# Patient Record
Sex: Male | Born: 1980 | Race: White | Hispanic: No | Marital: Single | Smoking: Never smoker
Health system: Southern US, Community
[De-identification: ages and names within clinical notes are randomized; demographics above are authoritative.]

## PROBLEM LIST (undated history)

## (undated) DIAGNOSIS — F32A Depression, unspecified: Secondary | ICD-10-CM

## (undated) DIAGNOSIS — F419 Anxiety disorder, unspecified: Secondary | ICD-10-CM

## (undated) DIAGNOSIS — F988 Other specified behavioral and emotional disorders with onset usually occurring in childhood and adolescence: Secondary | ICD-10-CM

## (undated) DIAGNOSIS — R251 Tremor, unspecified: Secondary | ICD-10-CM

## (undated) DIAGNOSIS — M419 Scoliosis, unspecified: Secondary | ICD-10-CM

## (undated) DIAGNOSIS — F329 Major depressive disorder, single episode, unspecified: Secondary | ICD-10-CM

## (undated) HISTORY — DX: Depression, unspecified: F32.A

## (undated) HISTORY — DX: Other specified behavioral and emotional disorders with onset usually occurring in childhood and adolescence: F98.8

## (undated) HISTORY — DX: Major depressive disorder, single episode, unspecified: F32.9

## (undated) HISTORY — PX: CARDIAC CATHETERIZATION: SHX172

---
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
## 2002-02-25 ENCOUNTER — Encounter: Admission: RE | Admit: 2002-02-25 | Discharge: 2002-02-25 | Payer: Self-pay | Admitting: Occupational Medicine

## 2002-02-25 ENCOUNTER — Encounter: Payer: Self-pay | Admitting: Occupational Medicine

## 2006-05-29 ENCOUNTER — Ambulatory Visit: Payer: Self-pay | Admitting: Family Medicine

## 2006-06-20 ENCOUNTER — Ambulatory Visit: Payer: Self-pay | Admitting: Internal Medicine

## 2006-06-20 ENCOUNTER — Observation Stay (HOSPITAL_COMMUNITY): Admission: EM | Admit: 2006-06-20 | Discharge: 2006-06-21 | Payer: Self-pay | Admitting: Emergency Medicine

## 2006-06-29 ENCOUNTER — Ambulatory Visit: Payer: Self-pay | Admitting: Cardiology

## 2006-06-29 LAB — CONVERTED CEMR LAB
BUN: 11 mg/dL (ref 6–23)
CO2: 32 meq/L (ref 19–32)
Calcium: 9.8 mg/dL (ref 8.4–10.5)
Chloride: 106 meq/L (ref 96–112)
Creatinine, Ser: 0.4 mg/dL (ref 0.4–1.5)
GFR calc Af Amer: 337 mL/min
GFR calc non Af Amer: 279 mL/min
Glucose, Bld: 89 mg/dL (ref 70–99)
Potassium: 5.3 meq/L — ABNORMAL HIGH (ref 3.5–5.1)
Sodium: 144 meq/L (ref 135–145)

## 2006-07-16 ENCOUNTER — Ambulatory Visit: Payer: Self-pay | Admitting: Family Medicine

## 2006-07-24 ENCOUNTER — Encounter: Payer: Self-pay | Admitting: Cardiology

## 2006-07-24 ENCOUNTER — Ambulatory Visit: Payer: Self-pay

## 2006-09-02 ENCOUNTER — Ambulatory Visit: Payer: Self-pay | Admitting: Cardiology

## 2007-09-02 ENCOUNTER — Ambulatory Visit: Payer: Self-pay | Admitting: Family Medicine

## 2007-09-13 ENCOUNTER — Emergency Department (HOSPITAL_COMMUNITY): Admission: EM | Admit: 2007-09-13 | Discharge: 2007-09-13 | Payer: Self-pay | Admitting: Emergency Medicine

## 2007-09-22 ENCOUNTER — Ambulatory Visit: Payer: Self-pay | Admitting: Cardiology

## 2008-02-07 ENCOUNTER — Ambulatory Visit: Payer: Self-pay | Admitting: Family Medicine

## 2008-05-17 ENCOUNTER — Emergency Department (HOSPITAL_BASED_OUTPATIENT_CLINIC_OR_DEPARTMENT_OTHER): Admission: EM | Admit: 2008-05-17 | Discharge: 2008-05-17 | Payer: Self-pay | Admitting: Emergency Medicine

## 2008-05-17 ENCOUNTER — Ambulatory Visit: Payer: Self-pay | Admitting: Diagnostic Radiology

## 2008-11-07 ENCOUNTER — Other Ambulatory Visit (HOSPITAL_COMMUNITY): Admission: RE | Admit: 2008-11-07 | Discharge: 2008-11-22 | Payer: Self-pay | Admitting: Psychiatry

## 2008-11-08 ENCOUNTER — Ambulatory Visit: Payer: Self-pay | Admitting: Psychiatry

## 2009-07-25 ENCOUNTER — Emergency Department (HOSPITAL_BASED_OUTPATIENT_CLINIC_OR_DEPARTMENT_OTHER): Admission: EM | Admit: 2009-07-25 | Discharge: 2009-07-25 | Payer: Self-pay | Admitting: Emergency Medicine

## 2009-07-27 ENCOUNTER — Ambulatory Visit: Payer: Self-pay | Admitting: Family Medicine

## 2010-07-02 NOTE — Assessment & Plan Note (Signed)
[name]                            CARDIOLOGY OFFICE NOTE   Paul [name], Paul [name]                     MRN:          [id number]  DATE:09/22/2007                            DOB:          1980-04-18    PRIMARY CARDIOLOGIST:  Sharlot Gowda, MD.   REASON FOR PRESENTATION:  The patient with chest pain.   HISTORY OF PRESENT ILLNESS:  This is a second office visit for this  young gentleman who had an episode of chest discomfort while running a  marathon last year.  He actually had an abnormal EKG and ended up having  a catheterization urgently by Dr. Juanda Chance.  He was found to have normal  coronary angiogram and left ventricular wall motion.  I performed an  echocardiogram on him, which demonstrated his EF to be 55%.  There were  no wall motion abnormalities.   The patient recently presented to the emergency room because of an  episode of chest discomfort.  This happened at rest.  He described as  sharp, kind of left-sided discomfort.  In the emergency room, there was  a questionable early consolidation consistent with atelectasis or  pneumonia, and he was treated with antibiotics.  His labs were otherwise  unremarkable.  He since has had no further chest discomfort.  He has  been doing quite a bit of running.  He has done this since his ER visit.  He has had no further episodes of chest discomfort, neck or arm  discomfort.  He has had no palpitation, presyncope or syncope.  He  denies any PND or orthopnea.   PAST MEDICAL HISTORY:  None.   ALLERGIES:  SULFA.   MEDICATIONS:  None.   REVIEW OF SYSTEMS:  As stated in the HPI and otherwise negative for  other systems.   PHYSICAL EXAMINATION:  GENERAL:  The patient is well appearing.  He is a  thin athletic appearing young man.  VITALS:  Weight 139 pounds, body mass index 21, blood pressure 126/80,  heart rate 76 and regular.  HEENT:  Eyes unremarkable.  Pupils equal, round, and reactive to light.  Fundi not visualized.  Oral mucosa unremarkable.  NECK:  No jugular venous distention at 45 degrees, carotid upstroke  brisk and symmetrical.  No bruits, no thyromegaly.  LYMPHATICS:  No cervical, axillary, inguinal adenopathy.  LUNGS:  Clear to auscultation bilaterally.  BACK:  No costovertebral angle tenderness.  CHEST:  Unremarkable.  HEART:  PMI not displaced or sustained.  S1 and S2 within normal limits.  No S3, no S4.  No clicks, no rubs, no murmurs.  ABDOMEN:  Flat, positive  bowel sounds, normal in frequency and pitch.  No bruits, no rebound, no  guarding or midline pulsatile mass.  No hepatomegaly, no splenomegaly.  SKIN:  No rashes, no nodules.  EXTREMITIES:  2+ pulses throughout, no edema, no cyanosis or clubbing.  NEURO:  Oriented to person, place, and time.  Cranial nerves II through  XII grossly intact.  Motor grossly intact.   EKG sinus rhythm, incomplete right bundle branch block, right axis  deviation, early repolarization pattern,  no change from previous EKGs.   ASSESSMENT/PLAN:  1. The patient's chest discomfort of 90% at the emergency room was      quite atypical.  He was not thought to have any cardiac etiology.      Since being treated for possible pneumonia, he has had no further      symptoms.  He has been able to be quite active.  With his level of      exercise, he has not had any further chest discomfort.  He has had      no presyncope or syncope or shortness of breath.  Given all of      this, no further cardiovascular testing is suggested.  2. Followup.  The patient will come back to this clinic as needed.     Rollene Rotunda, MD, [facility]  Electronically Signed    JH/MedQ  DD: 09/22/2007  DT: 09/23/2007  Job #: [id number]   cc:   Sharlot Gowda, M.D.

## 2010-07-02 NOTE — Cardiovascular Report (Signed)
NAMERUTHERFORD, [name]              ACCOUNT NO.:  [id number]   MEDICAL RECORD NO.:  [id number]          PATIENT TYPE:  INP   LOCATION:  2012                         FACILITY:  [facility]   PHYSICIAN:  Everardo Beals. Juanda Chance, MD, FACCDATE OF BIRTH:  [birth year]   DATE OF PROCEDURE:  06/20/2006  DATE OF DISCHARGE:                            CARDIAC CATHETERIZATION   CLINICAL HISTORY:  Paul [name] is 30 years old and has no prior  history of known heart disease.  He was running in the [facility] today and at 17 miles developed chest pain.  EMS was called and  his ECG suggested anterior ST elevation.  Code STEMI was called and he  was brought to [facility] where he was seen by Dr. Berton Mount.  A  decision was made to take him to the catheterization laboratory.  We  were not sure if the ST-segment elevation was true injury or if was  early repolarization.   PROCEDURE:  The procedure was performed via the right femoral using  arterial sheath and 6-French preformed coronary catheters.  A front wall  arterial puncture was performed and Omnipaque contrast was used.  Aortic  root injection was performed to rule out aortic dissection.  Right  femoral artery was closed with Angio-Seal at the end of the procedure,  although seal did not hold well and we had to use manual pressure.  The  patient tolerated the procedure well and left the laboratory in  satisfactory condition.   RESULTS:  The aortic pressure was 86/60 with a mean of 74 and left  ventricular pressure was 86/17.   Left main coronary main:  Left main coronary artery was free of  significant disease.   Left anterior descending artery:  Left anterior descending artery gave  rise to four diagonal branches and three septal perforators.  There was  slow (TIMI II) flow down the LAD but no significant obstruction and no  evident plaque.   The circumflex artery:  The circumflex artery gave rise to a marginal  branch and two  posterolateral branches.  These vessels were free of  significant disease.  The flow was slightly slow down the circumflex  artery but not quite as much as in the LAD.   The right side coronary:  The right coronary artery was a moderate-sized  vessel that gave rise to a conus branch, a right ventricular branch,  posterior descending branch and three posterolateral branches.  This  vessel was free of significant disease but there was also slightly slow  flow.  Took about three cycles for the vessel to completely fill.   LEFT VENTRICULOGRAM:  Performed in the RAO projection showed good wall  motion with no areas of hypokinesis.  Ventricle was slightly pointed  suggesting possible LVH.  The estimated fraction was 55%.   Aortic root injection:  Aorta root injection showed no aortic  insufficiency and no evidence of aortic dissection.   CONCLUSION:  Normal coronary angiography and left ventricular wall  motion.   RECOMMENDATIONS:  Reassurance.  In view of these findings, I do not  think the ECG  changes represented true injury and rather were due to  early repolarization.  The etiology of the chest pain is not clear but  may be related to reflux or may be musculoskeletal.  Will plan to treat  him with a proton pump inhibitor and will plan on observation today.      Paul [name] Juanda Chance, MD, [facility]  Electronically Signed     BRB/MEDQ  D:  06/20/2006  T:  06/20/2006  Job:  [id number]   cc:   Duke Salvia, MD, [facility]  Cardiopulmonary Lab

## 2010-07-02 NOTE — Assessment & Plan Note (Signed)
[facility]                            CARDIOLOGY OFFICE NOTE   Paul [name], Paul [name]                     MRN:          [id number]  DATE:09/02/2006                            DOB:          1981-01-08    PRIMARY CARE PHYSICIAN:  Sharlot Gowda, M.D.   REASON FOR PRESENTATION:  Evaluate patient with chest pain.   HISTORY OF PRESENT ILLNESS:  This is the second visit for this gentleman  with a history of chest discomfort that occurred while he was on mile 17  of a marathon.  He was having some discomfort when he saw me back in the  office.  He was briefly treated with Nexium.  He said his symptoms  subsequently resolved.  He self-discontinued the Nexium.  He said he has  been back to running and biking vigorously.  He has not been bringing on  any of that chest discomfort.  He has had no excessive shortness of  breath.  He thinks his exercise tolerance is fine.  He has had no  palpitations, presyncope, or syncope.  He did have an echocardiogram  which demonstrated an EF of 55% which would be normal for this  gentleman, and no valvular abnormalities.  He had normal left  ventricular size and wall thickness.   PAST MEDICAL HISTORY:  None.   ALLERGIES:  SULFA.   MEDICATIONS:  None.   REVIEW OF SYSTEMS:  As stated in the HPI, and are negative for other  systems.   PHYSICAL EXAMINATION:  GENERAL:  The patient is in no distress.  VITAL SIGNS:  Blood pressure 98/76, heart rate 70 and regular.  NECK:  No jugular venous distention at 45 degrees.  Carotid upstrokes  brisk and symmetrical.  No bruits, no thyromegaly.  LYMPHATIC:  No nodules.  LUNGS:  Clear to auscultation bilaterally.  HEART:  PMI not displaced or sustained.  S1 and S2 within normal limits.  No S3, no S4, no clicks, no rubs, no murmurs.  ABDOMEN:  Flat.  No rebound.  No guarding.  EXTREMITIES:  2+ pulses, no edema.   ASSESSMENT AND PLAN:  1. Chest discomfort.  The patient's chest  discomfort has resolved.      There was no clear      cardiac etiology.  No further cardiovascular testing is suggested      at this point.  2. Followup.  He can come back to this clinic as needed.     Rollene Rotunda, MD, [facility]  Electronically Signed    JH/MedQ  DD: 09/02/2006  DT: 09/03/2006  Job #: [id number]   cc:   Sharlot Gowda, M.D.

## 2010-07-02 NOTE — Assessment & Plan Note (Signed)
[facility]                            CARDIOLOGY OFFICE NOTE   Paul, [name]                     MRN:          [id number]  DATE:06/29/2006                            DOB:          1980-10-11    PRIMARY:  Sharlot Gowda, M.D.   REASON FOR PRESENTATION:  Evaluate patient with chest pain.   HISTORY OF PRESENT ILLNESS:  The patient is a pleasant 30 year old  gentleman who had chest pain at mile 30 of the [facility].  He  had no prior cardiac history.  He did have an abnormal appearance to his  EKG with diffuse ST elevation.  It was treated as a Code STEMI.  He had  some very slight enzyme elevations but again, he had run 17 miles that  morning.   The events surrounding this were that he developed the chest discomfort  around mile 17 and began to walk.  He said by the time he had to sit  down and EMS arrived he was probably hyperventilating.  He developed  some chest soreness with that.  He did have a cardiac catheterization  and had normal coronaries.   Since that time, he has not been running but has been active.  He has  had no chest discomfort, neck or arm discomfort.  He does not describe  palpitations, presyncope or syncope.  He has had no shortness of breath,  PND or orthopnea.  He had had no symptoms prior to this.   PAST MEDICAL HISTORY:  None.   ALLERGIES:  SULFA.   MEDICATIONS:  None.   REVIEW OF SYSTEMS:  As stated in the HPI and negative for all other  systems.   PHYSICAL EXAMINATION:  GENERAL:  The patient is in no distress.  VITAL SIGNS:  Blood pressure 112/82, heart rate 72 and regular, weight  138 pounds, body mass index 20.  HEENT:  Eyelids unremarkable.  Pupils equal, round, and react to light.  Fundi not visualized.  Oral mucosa unremarkable.  NECK:  No jugular venous distention at 45 degrees.  Carotid upstroke  brisk and symmetric.  No bruits, no thyromegaly.  LYMPHATICS:  No cervical, axillary or  inguinal adenopathy.  LUNGS:  Clear to auscultation bilaterally.  BACK:  No costovertebral angle tenderness.  CHEST:  Unremarkable.  HEART:  PMI not displaced or sustained.  S1 and S2 within normal limits.  No S3, no S4.  No clicks, rubs or murmurs.  ABDOMEN:  Flat, positive bowel sounds normal in frequency and pitch.  No  bruits, no rebound, no guarding.  No midline pulsatile mass.  No  hepatosplenomegaly, no splenomegaly.  SKIN:  No rashes, no nodules.  EXTREMITIES:  Show 2+ pulses, no edema, no cyanosis, no clubbing.  NEUROLOGIC:  Oriented to person, place and time.  Cranial nerves II-XII  grossly intact.  Motor grossly intact throughout.   EKG:  Sinus rhythm, rate 73, early repolarization, right axis deviation.   ASSESSMENT AND PLAN:  Chest discomfort.  The patient has had no further  chest discomfort.  This occurred under the extraordinary circumstances  of 18 miles of  running in the heat.  He had normal coronaries.  At this  point I am going to check an echocardiogram.  There was some question of  possible LVH.  The most likely etiology is musculoskeletal or GI.  He  will be able to resume running.  I have cautioned him that he needs to  run with people at the beginning.  I have cautioned no more than 4  miles.  I have suggested inside on a treadmill.  In particular, I  cautioned about avoiding the heat.  1. Followup.  I would like to see him back in about 6 months or sooner      if needed.   ADDENDUM:  This is an addendum to the clinical note.  Initially when we  were talking with Paul [name] he denied any chest pain.  However, as he  was getting his blood drawn, we talked again.  It turns out that he is  having recurrent chest discomfort.  He describes this mostly at night.  He describes it as short.  It is sharp.  It is moderately intense.  He  may have that during the day as well.  He does not think that he can  bring the sound.  There is no associated nausea, vomiting,  or  diaphoresis.  There is no radiation.  It is substernal.  Again, there  does not appear to be a cardiac etiology to this.  I did give him a  prescription for Nexium.  I have also discussed this with Dr. Susann Givens.  I will be in contact with the patient in the next few days to see if he  is in any improvement, though I know that Nexium might take awhile.  I  am also going to ask him to follow up with Dr. Susann Givens.  He will get the  echo was previously described.  Again, he is going to try to limit his  activities, and not do the excessive exercise that he was previously  doing.     Rollene Rotunda, MD, [facility]     JH/MedQ  DD: 06/29/2006  DT: 06/29/2006  Job #: [id number]   cc:   Sharlot Gowda, M.D.

## 2010-07-02 NOTE — Assessment & Plan Note (Signed)
[facility]                            CARDIOLOGY OFFICE NOTE   NAME:Paul [name], Paul [name]                     MRN:          [id number]  DATE:06/29/2006                            DOB:          1980-10-09    ADDENDUM:  This is an addendum to the clinical note.  Initially when we  were talking with Mr. Kiehn he denied any chest pain.  However, as he  was getting his blood drawn, we talked again.  It turns out that he is  having recurrent chest discomfort.  He describes this mostly at night.  He describes it as short.  It is sharp.  It is moderately intense.  He  may have that during the day as well.  He does not think that he can  bring the sound.  There is no associated nausea, vomiting, or  diaphoresis.  There is no radiation.  It is substernal.  Again, there  does not appear to be a cardiac etiology to this.  I did give him a  prescription for Nexium.  I have also discussed this with Dr. Susann Givens.  I will be in contact with the patient in the next few days to see if he  is in any improvement, though I know that Nexium might take awhile.  I  am also going to ask him to follow up with Dr. Susann Givens.  He will get the  echo was previously described.  Again, he is going to try to limit his  activities, and not do the excessive exercise that he was previously  doing.     Rollene Rotunda, MD, [facility]     JH/MedQ  DD: 06/29/2006  DT: 06/29/2006  Job #: [id number]   cc:   Sharlot Gowda, M.D.

## 2010-07-05 NOTE — Discharge Summary (Signed)
[name], Paul              ACCOUNT NO.:  [id number]   MEDICAL RECORD NO.:  [id number]          PATIENT TYPE:  OBV   LOCATION:  2012                         FACILITY:  [facility]   PHYSICIAN:  Duke Salvia, MD, FACCDATE OF BIRTH:  30-May-1980   DATE OF ADMISSION:  06/20/2006  DATE OF DISCHARGE:  06/21/2006                         DISCHARGE SUMMARY - REFERRING   BRIEF HISTORY:  The patient is a 30 year old male who was running in the  [facility] marathon on the day of admission, developed chest discomfort.  An EKG by EMS suggested ST-segment elevation in anterior leads.  A Code  Stimi was called and he was brought to the emergency room.  It was not  clear if the ST-segment elevation was true injury or early  repolarization.  Note there is not a history and physical on the chart  and I have never met the patient.   LABORATORY:  Admission H and H was 16.0, 46.9, normal indices.  Platelets 271, WBC's 21.7 neutrophils 88.  Sodium 144, potassium 3.7,  BUN 13, creatinine 1.07, glucose 135.  LFT's were within normal limits  except AST of 56.  CK MB's were elevated, relative indices were within  normal limits.  Troponins were slightly abnormal 0.1, 0.28 and 0.15.   EKG showed normal sinus rhythm with a ventricular rate of 72, normal  axis, early R-wave, diffuse J-point elevation.   HOSPITAL COURSE:  The patient was taken emergently to the cardiac  catheterization laboratory by Dr. Charlies Constable.  Catheterization did not  show any coronary artery disease.  His EF was 55% too.  However, Dr.  Juanda Chance did note that he had TIMI-II flow in the LAD.  There was no  source of ischemia.  It was felt that his chest discomfort was not  cardiac.  Post sheath removal and bedrest the patient was ambulating  without difficulty.  Catheterization site was intact.  Dr. Graciela Husbands noted  on 06/20/2006 that he felt his discomfort was atypical and he did note  chest wall tenderness.  He felt that the EKG probably  was early  repolarization, but he is cardiac risk factors with early family  history.  Dr. Graciela Husbands felt the discomfort was more likely musculoskeletal.  His low potassium prior to discharge was repleted and he was discharged  home by Dr. Graciela Husbands.   DISCHARGE DIAGNOSIS:  Chest discomfort of uncertain etiology, probably  musculoskeletal.  No evidence of coronary artery disease.  Abnormal EKG.  Hypokalemia.   Dr. Graciela Husbands discharged the patient home, asked him to maintain low-sodium,  heart-healthy and high potassium diet.  He was given permission to  return to work on Wednesday.  He was advised no lifting, driving, sexual  activity for a half a week.  Dr. Graciela Husbands stated that the office will call  him with follow-up appointment.     Paul Rued, PA-C      Duke Salvia, MD, [facility]  Electronically Signed   EW/MEDQ  D:  10/27/2006  T:  10/27/2006  Job:  [id number]

## 2010-11-15 LAB — POCT I-STAT, CHEM 8
BUN: 8
Calcium, Ion: 1.2
Chloride: 103
Creatinine, Ser: 0.8
Glucose, Bld: 91
HCT: 44
Hemoglobin: 15
Potassium: 3.7
Sodium: 140
TCO2: 31

## 2011-05-08 ENCOUNTER — Encounter: Payer: Self-pay | Admitting: Internal Medicine

## 2011-05-09 ENCOUNTER — Encounter: Payer: Self-pay | Admitting: Family Medicine

## 2011-05-09 ENCOUNTER — Ambulatory Visit (INDEPENDENT_AMBULATORY_CARE_PROVIDER_SITE_OTHER): Payer: 59 | Admitting: Family Medicine

## 2011-05-09 VITALS — BP 126/86 | HR 80 | Ht 68.0 in | Wt 144.0 lb

## 2011-05-09 DIAGNOSIS — Z0000 Encounter for general adult medical examination without abnormal findings: Secondary | ICD-10-CM

## 2011-05-09 NOTE — Progress Notes (Signed)
Subjective:    Patient ID: Paul [name], male    DOB: Jul 02, 1980, 31 y.o.   MRN: [id number]  HPI He is here for complete examination. He has not been seen here in several years. He plans to walk a half marathon. He has a previous history of chest pain that was extensively evaluated after he competed in a marathon. He also has a previous history of back pain. The back pain was evaluated and he was treated with physical therapy. He is doing well and not having difficulty at work but does have difficulty with back pain when he tries to run and he therefore has stopped running. He uses other forms of exercise to keep himself in good physical condition. He also notes previous psychiatric history and evaluation. He admits to having high and low spells but feels that this is mainly related to work stress. In the past he has been involved in counseling. He also tried Theatre [employer] for his underlying ADD but found it to cause difficulty with depression. He also is estranged from his brother due to past history of interpersonal difficulties revolving around an x- girlfriend.   Review of Systems  Constitutional: Negative.   HENT: Negative.   Eyes: Negative.   Respiratory: Negative.   Cardiovascular: Negative.   Gastrointestinal: Negative.   Genitourinary: Negative.   Musculoskeletal: Positive for back pain.  Skin: Negative.   Neurological: Negative.   Hematological: Negative.   Psychiatric/Behavioral: Positive for dysphoric mood.       Objective:   Physical Exam BP 126/86  Pulse 80  Ht 5\' 8"  (1.727 m)  Wt 144 lb (65.318 kg)  BMI 21.90 kg/m2  SpO2 98%  General Appearance:    Alert, cooperative, no distress, appears stated age  Head:    Normocephalic, without obvious abnormality, atraumatic  Eyes:    PERRL, conjunctiva/corneas clear, EOM's intact, fundi    benign  Ears:    Normal TM's and external ear canals  Nose:   Nares normal, mucosa normal, no drainage or sinus   tenderness  Throat:    Lips, mucosa, and tongue normal; teeth and gums normal  Neck:   Supple, no lymphadenopathy;  thyroid:  no   enlargement/tenderness/nodules; no carotid   bruit or JVD  Back:    Spine nontender, no curvature, ROM normal, no CVA     tenderness  Lungs:     Clear to auscultation bilaterally without wheezes, rales or     ronchi; respirations unlabored  Chest Wall:    No tenderness or deformity   Heart:    Regular rate and rhythm, S1 and S2 normal, no murmur, rub   or gallop  Breast Exam:    No chest wall tenderness, masses or gynecomastia  Abdomen:     Soft, non-tender, nondistended, normoactive bowel sounds,    no masses, no hepatosplenomegaly  Genitalia:    Normal male external genitalia without lesions.  Testicles without masses.  No inguinal hernias.  Rectal:   Deferred due to age <40 and lack of symptoms  Extremities:   No clubbing, cyanosis or edema  Pulses:   2+ and symmetric all extremities  Skin:   Skin color, texture, turgor normal, no rashes or lesions  Lymph nodes:   Cervical, supraclavicular, and axillary nodes normal  Neurologic:   CNII-XII intact, normal strength, sensation and gait; reflexes 2+ and symmetric throughout          Psych:   Normal mood, affect, hygiene and grooming.  Assessment & Plan:   1. Routine general medical examination at a health care facility  CBC with Differential, Comprehensive metabolic panel, Lipid panel   I discussed the mood issue with him telling him this could easily could be a mood disorder. Recommended counseling for him to further evaluate this however he at this point is reluctant to do this. Also encouraged him to resolve the differences that he has with his brother.

## 2011-05-10 LAB — COMPREHENSIVE METABOLIC PANEL
Albumin: 4.8 g/dL (ref 3.5–5.2)
BUN: 9 mg/dL (ref 6–23)
CO2: 27 mEq/L (ref 19–32)
Glucose, Bld: 98 mg/dL (ref 70–99)
Potassium: 4.5 mEq/L (ref 3.5–5.3)
Sodium: 144 mEq/L (ref 135–145)
Total Protein: 6.8 g/dL (ref 6.0–8.3)

## 2011-05-10 LAB — CBC WITH DIFFERENTIAL/PLATELET
Eosinophils Absolute: 0.1 10*3/uL (ref 0.0–0.7)
HCT: 49.8 % (ref 39.0–52.0)
Hemoglobin: 16.8 g/dL (ref 13.0–17.0)
Lymphs Abs: 2.5 10*3/uL (ref 0.7–4.0)
MCH: 29.7 pg (ref 26.0–34.0)
MCHC: 33.7 g/dL (ref 30.0–36.0)
Monocytes Absolute: 0.5 10*3/uL (ref 0.1–1.0)
Monocytes Relative: 7 % (ref 3–12)
Neutro Abs: 3.8 10*3/uL (ref 1.7–7.7)
Neutrophils Relative %: 55 % (ref 43–77)
RBC: 5.65 MIL/uL (ref 4.22–5.81)

## 2011-05-10 LAB — LIPID PANEL
Cholesterol: 142 mg/dL (ref 0–200)
HDL: 45 mg/dL (ref >39)
Triglycerides: 52 mg/dL (ref <150)

## 2012-05-14 ENCOUNTER — Emergency Department (HOSPITAL_BASED_OUTPATIENT_CLINIC_OR_DEPARTMENT_OTHER): Payer: Worker's Compensation

## 2012-05-14 ENCOUNTER — Encounter (HOSPITAL_BASED_OUTPATIENT_CLINIC_OR_DEPARTMENT_OTHER): Payer: Self-pay | Admitting: *Deleted

## 2012-05-14 ENCOUNTER — Emergency Department (HOSPITAL_BASED_OUTPATIENT_CLINIC_OR_DEPARTMENT_OTHER)
Admission: EM | Admit: 2012-05-14 | Discharge: 2012-05-15 | Disposition: A | Discharge status: Home / Self Care | Payer: Worker's Compensation | Attending: Emergency Medicine | Admitting: Emergency Medicine

## 2012-05-14 DIAGNOSIS — Y990 Civilian activity done for income or pay: Secondary | ICD-10-CM | POA: Insufficient documentation

## 2012-05-14 DIAGNOSIS — Y9389 Activity, other specified: Secondary | ICD-10-CM | POA: Insufficient documentation

## 2012-05-14 DIAGNOSIS — X500XXA Overexertion from strenuous movement or load, initial encounter: Secondary | ICD-10-CM | POA: Insufficient documentation

## 2012-05-14 DIAGNOSIS — S63659A Sprain of metacarpophalangeal joint of unspecified finger, initial encounter: Secondary | ICD-10-CM | POA: Insufficient documentation

## 2012-05-14 DIAGNOSIS — Y9289 Other specified places as the place of occurrence of the external cause: Secondary | ICD-10-CM | POA: Insufficient documentation

## 2012-05-14 DIAGNOSIS — Z8659 Personal history of other mental and behavioral disorders: Secondary | ICD-10-CM | POA: Insufficient documentation

## 2012-05-14 DIAGNOSIS — S63653A Sprain of metacarpophalangeal joint of left middle finger, initial encounter: Secondary | ICD-10-CM

## 2012-05-14 NOTE — ED Provider Notes (Signed)
History     CSN: [id number]  Arrival date & time 05/14/12  2316   First MD Initiated Contact with Patient 05/14/12 2341      Chief Complaint  Patient presents with  . Hand Injury    (Consider location/radiation/quality/duration/timing/severity/associated sxs/prior treatment) HPI This is a 32 year old male who was carrying a box at work this evening and hyperextended his left second and fifth fingers. He is having moderate pain in the third metacarpophalangeal joint. There is no deformity or swelling and motor, tendons and sensory function remained intact. He denies other injury.  Past Medical History  Diagnosis Date  . ADD (attention deficit disorder)     Past Surgical History  Procedure Laterality Date  . Cardiac catheterization      No family history on file.  History  Substance Use Topics  . Smoking status: Never Smoker   . Smokeless tobacco: Never Used  . Alcohol Use: 0.6 oz/week    1 Glasses of wine per week      Review of Systems  All other systems reviewed and are negative.    Allergies  Sulfa antibiotics  Home Medications  No current outpatient prescriptions on file.  BP 122/84  Pulse 72  Temp(Src) 98.3 F (36.8 C) (Oral)  Resp 18  Ht 5\' 8"  (1.727 m)  Wt 143 lb (64.864 kg)  BMI 21.75 kg/m2  SpO2 99%  Physical Exam General: Well-developed, well-nourished male in no acute distress; appearance consistent with age of record HENT: normocephalic, atraumatic Eyes: pupils equal round and reactive to light; extraocular muscles intact Neck: supple Heart: regular rate and rhythm Lungs: clear to auscultation bilaterally Abdomen: soft; nondistended Extremities: Tenderness of the left third metacarpophalangeal joints with somewhat limited range of motion of this finger, left hand without deformity or swelling, with tendon function, motor function and sensory function intact distally with brisk capillary refill Neurologic: Awake, alert and oriented;  motor function intact in all extremities and symmetric; no facial droop Skin: Warm and dry Psychiatric: Normal mood and affect    ED Course  Procedures (including critical care time)   MDM  Nursing notes and vitals signs, including pulse oximetry, reviewed.  Summary of this visit's results, reviewed by myself:  Imaging Studies: Dg Hand Complete Left  05/14/2012  *RADIOLOGY REPORT*  Clinical Data: Left middle finger pain, hand injury moving boxes  LEFT HAND - COMPLETE 3+ VIEW  Comparison: None  Findings: Bone mineralization normal. Joint spaces preserved. No fracture, dislocation, or bone destruction.  IMPRESSION: Normal exam.   Original Report Authenticated By: Ulyses Southward, M.D.    12:02 AM We will split the left middle finger as this is the only finger the patient is complaining about.          Hanley Seamen, MD 05/15/12 0003

## 2012-05-14 NOTE — ED Notes (Signed)
MD at bedside. 

## 2012-05-14 NOTE — ED Notes (Signed)
Pt was at work tonight and while moving a box, he hyper extended his 2nd thru 5th fingers.

## 2012-05-15 MED ORDER — NAPROXEN 250 MG PO TABS
500.0000 mg | ORAL_TABLET | Freq: Once | ORAL | Status: AC
  Administered 2012-05-15: 500 mg via ORAL
  Filled 2012-05-15: qty 2

## 2012-05-15 NOTE — ED Notes (Signed)
UDS and finger splint is complete.

## 2012-08-09 ENCOUNTER — Encounter (HOSPITAL_BASED_OUTPATIENT_CLINIC_OR_DEPARTMENT_OTHER): Payer: Self-pay

## 2012-08-09 ENCOUNTER — Emergency Department (HOSPITAL_BASED_OUTPATIENT_CLINIC_OR_DEPARTMENT_OTHER): Payer: No Typology Code available for payment source

## 2012-08-09 ENCOUNTER — Emergency Department (HOSPITAL_BASED_OUTPATIENT_CLINIC_OR_DEPARTMENT_OTHER)
Admission: EM | Admit: 2012-08-09 | Discharge: 2012-08-09 | Disposition: A | Discharge status: Home / Self Care | Payer: No Typology Code available for payment source | Attending: Emergency Medicine | Admitting: Emergency Medicine

## 2012-08-09 DIAGNOSIS — Z9889 Other specified postprocedural states: Secondary | ICD-10-CM | POA: Insufficient documentation

## 2012-08-09 DIAGNOSIS — S0990XA Unspecified injury of head, initial encounter: Secondary | ICD-10-CM | POA: Insufficient documentation

## 2012-08-09 DIAGNOSIS — Z8659 Personal history of other mental and behavioral disorders: Secondary | ICD-10-CM | POA: Insufficient documentation

## 2012-08-09 DIAGNOSIS — Y92410 Unspecified street and highway as the place of occurrence of the external cause: Secondary | ICD-10-CM | POA: Insufficient documentation

## 2012-08-09 DIAGNOSIS — Y9389 Activity, other specified: Secondary | ICD-10-CM | POA: Insufficient documentation

## 2012-08-09 DIAGNOSIS — S161XXA Strain of muscle, fascia and tendon at neck level, initial encounter: Secondary | ICD-10-CM

## 2012-08-09 DIAGNOSIS — S39012A Strain of muscle, fascia and tendon of lower back, initial encounter: Secondary | ICD-10-CM

## 2012-08-09 DIAGNOSIS — S139XXA Sprain of joints and ligaments of unspecified parts of neck, initial encounter: Secondary | ICD-10-CM | POA: Insufficient documentation

## 2012-08-09 DIAGNOSIS — IMO0002 Reserved for concepts with insufficient information to code with codable children: Secondary | ICD-10-CM | POA: Insufficient documentation

## 2012-08-09 MED ORDER — CYCLOBENZAPRINE HCL 10 MG PO TABS: 10.0000 mg | ORAL_TABLET | Freq: Two times a day (BID) | ORAL | Status: DC | PRN

## 2012-08-09 MED ORDER — IBUPROFEN 800 MG PO TABS: 800.0000 mg | ORAL_TABLET | Freq: Three times a day (TID) | ORAL | Status: DC
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
## 2012-08-12 ENCOUNTER — Encounter: Payer: Self-pay | Admitting: Family Medicine

## 2012-08-12 ENCOUNTER — Ambulatory Visit (INDEPENDENT_AMBULATORY_CARE_PROVIDER_SITE_OTHER): Payer: 59 | Admitting: Family Medicine

## 2012-08-12 VITALS — BP 130/90 | HR 78 | Wt 140.0 lb

## 2012-08-12 DIAGNOSIS — M549 Dorsalgia, unspecified: Secondary | ICD-10-CM

## 2012-08-12 NOTE — Patient Instructions (Signed)
Ibuprofen regularly and you can use the muscle spasm medicine if you need it but mainly at night. Heat for 20 minutes 3 times per day
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
## 2012-09-07 ENCOUNTER — Ambulatory Visit: Payer: 59 | Admitting: Family Medicine

## 2012-09-10 ENCOUNTER — Telehealth: Payer: Self-pay | Admitting: Internal Medicine

## 2012-09-10 NOTE — Telephone Encounter (Signed)
Faxed over medical records to [facility] [employer]) @ [phone]

## 2012-09-28 ENCOUNTER — Ambulatory Visit: Payer: 59 | Admitting: Family Medicine

## 2012-10-07 ENCOUNTER — Ambulatory Visit (INDEPENDENT_AMBULATORY_CARE_PROVIDER_SITE_OTHER): Payer: 59 | Admitting: Family Medicine

## 2012-10-07 ENCOUNTER — Encounter: Payer: Self-pay | Admitting: Family Medicine

## 2012-10-07 VITALS — BP 110/70 | Wt 137.0 lb

## 2012-10-07 DIAGNOSIS — Z209 Contact with and (suspected) exposure to unspecified communicable disease: Secondary | ICD-10-CM

## 2012-10-07 NOTE — Progress Notes (Signed)
  Subjective:    Patient ID: Paul [name], male    DOB: 05-15-1980, 32 y.o.   MRN: [id number]  HPI He is here for an STD test. He did have unprotected sex approximately 10 days ago with his girlfriend. He has had no dysuria, frequency or penile lesions.. He states that he did have an HIV test done at work several months ago which was negative. He also complains of weight loss but admits to having a very poor diet.   Review of Systems     Objective:   Physical Exam  Alert and in no distress otherwise not examined      Assessment & Plan:  Contact with or exposure to unspecified communicable disease - Plan: GC/chlamydia probe amp, urine, RPR  also discussed better eating habits for him. If he continues to lose weight, history turned here for further evaluation.

## 2012-10-08 LAB — GC/CHLAMYDIA PROBE AMP, URINE
Chlamydia, Swab/Urine, PCR: POSITIVE — AB
GC Probe Amp, Urine: NEGATIVE

## 2012-10-08 LAB — RPR

## 2012-10-11 ENCOUNTER — Other Ambulatory Visit: Payer: Self-pay | Admitting: Family Medicine

## 2012-10-11 ENCOUNTER — Telehealth: Payer: Self-pay | Admitting: Family Medicine

## 2012-10-11 DIAGNOSIS — A749 Chlamydial infection, unspecified: Secondary | ICD-10-CM

## 2012-10-11 MED ORDER — DOXYCYCLINE HYCLATE 100 MG PO TABS: 100.0000 mg | ORAL_TABLET | Freq: Two times a day (BID) | ORAL | Status: DC

## 2012-10-11 NOTE — Telephone Encounter (Signed)
Called out Doxy 100 mg #20 BID to [facility] [phone].  Also Cheri, please make sure to report to Health dept.

## 2012-10-11 NOTE — Progress Notes (Signed)
I have called home number and work number.  Neither number good.  I have sent email to pt at [email or web address].  I will also send letter in mail.

## 2012-10-19 NOTE — Telephone Encounter (Signed)
DONE

## 2012-11-01 ENCOUNTER — Encounter: Payer: 59 | Admitting: Family Medicine

## 2013-01-07 ENCOUNTER — Telehealth: Payer: Self-pay | Admitting: Internal Medicine

## 2013-01-07 NOTE — Telephone Encounter (Signed)
Faxed over medical records to [employer] on 12/20/12

## 2014-04-18 ENCOUNTER — Encounter: Payer: Self-pay | Admitting: Family Medicine

## 2014-04-18 ENCOUNTER — Ambulatory Visit (INDEPENDENT_AMBULATORY_CARE_PROVIDER_SITE_OTHER): Payer: 59 | Admitting: Family Medicine

## 2014-04-18 VITALS — BP 120/80 | HR 70 | Ht 67.0 in | Wt 143.0 lb

## 2014-04-18 DIAGNOSIS — F909 Attention-deficit hyperactivity disorder, unspecified type: Secondary | ICD-10-CM | POA: Diagnosis not present

## 2014-04-18 DIAGNOSIS — Z0000 Encounter for general adult medical examination without abnormal findings: Secondary | ICD-10-CM

## 2014-04-18 DIAGNOSIS — Z209 Contact with and (suspected) exposure to unspecified communicable disease: Secondary | ICD-10-CM | POA: Diagnosis not present

## 2014-04-18 DIAGNOSIS — F988 Other specified behavioral and emotional disorders with onset usually occurring in childhood and adolescence: Secondary | ICD-10-CM | POA: Insufficient documentation

## 2014-04-18 LAB — COMPREHENSIVE METABOLIC PANEL
ALK PHOS: 57 U/L (ref 39–117)
ALT: 13 U/L (ref 0–53)
AST: 16 U/L (ref 0–37)
Albumin: 4.6 g/dL (ref 3.5–5.2)
BUN: 9 mg/dL (ref 6–23)
CO2: 31 mEq/L (ref 19–32)
Calcium: 9.8 mg/dL (ref 8.4–10.5)
Chloride: 102 mEq/L (ref 96–112)
Creat: 0.65 mg/dL (ref 0.50–1.35)
Glucose, Bld: 91 mg/dL (ref 70–99)
POTASSIUM: 4.1 meq/L (ref 3.5–5.3)
SODIUM: 142 meq/L (ref 135–145)
Total Bilirubin: 1 mg/dL (ref 0.2–1.2)
Total Protein: 6.6 g/dL (ref 6.0–8.3)

## 2014-04-18 LAB — CBC WITH DIFFERENTIAL/PLATELET
Basophils Absolute: 0 10*3/uL (ref 0.0–0.1)
Basophils Relative: 0 % (ref 0–1)
EOS ABS: 0.1 10*3/uL (ref 0.0–0.7)
EOS PCT: 1 % (ref 0–5)
HCT: 46.4 % (ref 39.0–52.0)
Hemoglobin: 16.1 g/dL (ref 13.0–17.0)
LYMPHS ABS: 2.1 10*3/uL (ref 0.7–4.0)
Lymphocytes Relative: 38 % (ref 12–46)
MCH: 30.1 pg (ref 26.0–34.0)
MCHC: 34.7 g/dL (ref 30.0–36.0)
MCV: 86.9 fL (ref 78.0–100.0)
MONOS PCT: 6 % (ref 3–12)
MPV: 10.7 fL (ref 8.6–12.4)
Monocytes Absolute: 0.3 10*3/uL (ref 0.1–1.0)
NEUTROS PCT: 55 % (ref 43–77)
Neutro Abs: 3 10*3/uL (ref 1.7–7.7)
Platelets: 196 10*3/uL (ref 150–400)
RBC: 5.34 MIL/uL (ref 4.22–5.81)
RDW: 13.7 % (ref 11.5–15.5)
WBC: 5.5 10*3/uL (ref 4.0–10.5)

## 2014-04-18 LAB — LIPID PANEL
Cholesterol: 156 mg/dL (ref 0–200)
HDL: 44 mg/dL (ref >=40)
LDL CALC: 96 mg/dL (ref 0–99)
Total CHOL/HDL Ratio: 3.5 Ratio
Triglycerides: 82 mg/dL (ref <150)
VLDL: 16 mg/dL (ref 0–40)

## 2014-04-18 NOTE — Progress Notes (Signed)
   Subjective:    Patient ID: Paul [name], male    DOB: 19-Dec-1980, 34 y.o.   MRN: [id number]  HPI He is here for a complete examination. He has enjoyed excellent health. He does have an underlying history of ADD however has not been on medication in several years. He seems to be doing well with this. He states his mood is much better. He has a much more positive approach towards living and handling of stress. He is exercising using his bicycle. Gave up running due to back trouble. His work is going well. He does have a 59 year old daughter. He is not married and is not presently sexually active but would like to be tested. His last encounter was over a year ago. Family history is unchanged other than his sister apparently had now has a pacemaker due to a rhythm disturbance. Immunizations are up-to-date.   Review of Systems  All other systems reviewed and are negative.      Objective:   Physical Exam BP 120/80 mmHg  Pulse 70  Ht 5\' 7"  (1.702 m)  Wt 143 lb (64.864 kg)  BMI 22.39 kg/m2  SpO2 98%  General Appearance:    Alert, cooperative, no distress, appears stated age  Head:    Normocephalic, without obvious abnormality, atraumatic  Eyes:    PERRL, conjunctiva/corneas clear, EOM's intact, fundi    benign  Ears:    Normal TM's and external ear canals  Nose:   Nares normal, mucosa normal, no drainage or sinus   tenderness  Throat:   Lips, mucosa, and tongue normal; teeth and gums normal  Neck:   Supple, no lymphadenopathy;  thyroid:  no   enlargement/tenderness/nodules; no carotid   bruit or JVD  Back:    Spine nontender, no curvature, ROM normal, no CVA     tenderness  Lungs:     Clear to auscultation bilaterally without wheezes, rales or     ronchi; respirations unlabored  Chest Wall:    No tenderness or deformity   Heart:    Regular rate and rhythm, S1 and S2 normal, no murmur, rub   or gallop  Breast Exam:    No chest wall tenderness, masses or gynecomastia  Abdomen:      Soft, non-tender, nondistended, normoactive bowel sounds,    no masses, no hepatosplenomegaly  Genitalia:    Normal male external genitalia without lesions.  Testicles without masses.  No inguinal hernias.  Rectal:   Deferred due to age <40 and lack of symptoms  Extremities:   No clubbing, cyanosis or edema  Pulses:   2+ and symmetric all extremities  Skin:   Skin color, texture, turgor normal, no rashes or lesions  Lymph nodes:   Cervical, supraclavicular, and axillary nodes normal  Neurologic:   CNII-XII intact, normal strength, sensation and gait; reflexes 2+ and symmetric throughout          Psych:   Normal mood, affect, hygiene and grooming.          Assessment & Plan:  Routine general medical examination at a health care facility - Plan: CBC with Differential/Platelet, Comprehensive metabolic panel, Lipid panel  Contact with or exposure to communicable disease - Plan: RPR, GC/chlamydia probe amp, urine, HIV antibody (with reflex), CANCELED: HIV antibody  ADD (attention deficit disorder) I encouraged him to continue to take good care of himself.

## 2014-04-19 LAB — RPR

## 2014-04-19 LAB — HIV ANTIBODY (ROUTINE TESTING W REFLEX): HIV: NONREACTIVE
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
## 2014-12-01 ENCOUNTER — Ambulatory Visit (HOSPITAL_COMMUNITY)
Admission: RE | Admit: 2014-12-01 | Discharge: 2014-12-01 | Disposition: A | Discharge status: Home / Self Care | Payer: 59 | Attending: Psychiatry | Admitting: Psychiatry

## 2014-12-01 ENCOUNTER — Telehealth (HOSPITAL_COMMUNITY): Payer: Self-pay

## 2014-12-01 NOTE — BH Assessment (Signed)
Assessment Note  Paul [name] is an 34 y.o. 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  Patient reports that he has a 34 year old daughter and lots of family support.  Patient reports that he he does not like working on Saturdays.  Patient denies denies SI/HI/Psychosis/Substance Abuse.  Patient denies physical, sexual or emotional abuse.  Patient reports that he would like to receive outpatient therapy   Per May, patient does not meet criteria for inpatient therapy.  Patient signed decline MSE Form.  Patient given outpatient resources.   Diagnosis: Major Depressive Disorder   Past Medical History:  Past Medical History  Diagnosis Date  . ADD (attention deficit disorder)     Past Surgical History  Procedure Laterality Date  . Cardiac catheterization      Family History:  Family History  Problem Relation Age of Onset  . Heart disease Sister 94    pacemaker    Social History:  reports that he has never smoked. He has never used smokeless tobacco. He reports that he drinks about 0.6 oz of alcohol per week. He reports that he does not use illicit drugs.  Additional Social History:  Alcohol / Drug Use History of alcohol / drug use?: No history of alcohol / drug abuse  CIWA:   COWS:    Allergies:  Allergies  Allergen Reactions  . Sulfa Antibiotics     Home Medications:  (Not in a hospital admission)  OB/GYN Status:  No LMP for male patient.  General Assessment Data Location of Assessment:  (Walk In at [facility]) TTS Assessment: In system Is this a Tele or Face-to-Face Assessment?: Face-to-Face Is this an Initial Assessment or a Re-assessment for this encounter?: Initial Assessment Marital status: Single Maiden name: na Is patient pregnant?: No Pregnancy Status: No Living Arrangements: Alone Can pt return to current living arrangement?: Yes Admission Status: Voluntary Is patient capable of signing voluntary admission?:  Yes Referral Source: Self/Family/Friend Insurance type: Generic Workers Health and safety inspector ([place]) Medical Exam completed: No Reason for MSE not completed: Patient Refused (Patient signed decline MSE Form )  Crisis Care Plan Living Arrangements: Alone Name of Psychiatrist: None Reported Name of Therapist: None Reported  Education Status Is patient currently in school?: No Current Grade: NA Highest grade of school patient has completed: NA Name of school: NA Contact person: NA  Risk to self with the past 6 months Suicidal Ideation: No Has patient been a risk to self within the past 6 months prior to admission? : No Suicidal Intent: No Has patient had any suicidal intent within the past 6 months prior to admission? : No Is patient at risk for suicide?: No Suicidal Plan?: No Has patient had any suicidal plan within the past 6 months prior to admission? : No Access to Means: No What has been your use of drugs/alcohol within the last 12 months?: na Previous Attempts/Gestures: No How many times?: 0 Other Self Harm Risks: None Reported Triggers for Past Attempts: None known Intentional Self Injurious Behavior: None Family Suicide History: No Recent stressful life event(s): Other (Comment) (Stress at work ) Persecutory voices/beliefs?: No Depression: Yes Depression Symptoms: Despondent Substance abuse history and/or treatment for substance abuse?: No Suicide prevention information given to non-admitted patients: Not applicable  Risk to Others within the past 6 months Homicidal Ideation: No Does patient have any lifetime risk of violence toward others beyond the six months prior to admission? :  No Thoughts of Harm to Others: No Current Homicidal Intent: No Current Homicidal Plan: No Access to Homicidal Means: No Identified Victim: NA History of harm to others?: No Assessment of Violence: None Noted Violent Behavior Description: NA Does patient have  access to weapons?: No Criminal Charges Pending?: No Does patient have a court date: No Is patient on probation?: No  Psychosis Hallucinations: None noted Delusions: None noted  Mental Status Report Appearance/Hygiene: Disheveled Eye Contact: Good Motor Activity: Freedom of movement Speech: Logical/coherent Level of Consciousness: Alert Mood: Depressed Affect: Appropriate to circumstance Anxiety Level: Minimal Thought Processes: Relevant, Coherent Judgement: Unimpaired Orientation: Person, Place, Time, Situation Obsessive Compulsive Thoughts/Behaviors: None  Cognitive Functioning Concentration: Decreased Memory: Remote Intact, Recent Intact IQ: Average Insight: Good Impulse Control: Good Appetite: Fair Weight Loss: 0 Weight Gain: 0 Sleep: No Change Total Hours of Sleep: 8 Vegetative Symptoms: None  ADLScreening [facility] Assessment Services) Patient's cognitive ability adequate to safely complete daily activities?: Yes Patient able to express need for assistance with ADLs?: Yes Independently performs ADLs?: Yes (appropriate for developmental age)  Prior Inpatient Therapy Prior Inpatient Therapy: No Prior Therapy Dates: NA Prior Therapy Facilty/Provider(s): NA Reason for Treatment: NA  Prior Outpatient Therapy Prior Outpatient Therapy: No Prior Therapy Dates: NA Prior Therapy Facilty/Provider(s): NA Reason for Treatment: NA Does patient have an ACCT team?: No Does patient have Intensive In-House Services?  : No Does patient have [facility] services? : No Does patient have P4CC services?: No  ADL Screening (condition at time of admission) Patient's cognitive ability adequate to safely complete daily activities?: Yes Is the patient deaf or have difficulty hearing?: No Does the patient have difficulty seeing, even when wearing glasses/contacts?: No Does the patient have difficulty concentrating, remembering, or making decisions?: No Patient able to express need for  assistance with ADLs?: Yes Does the patient have difficulty dressing or bathing?: No Independently performs ADLs?: Yes (appropriate for developmental age) Does the patient have difficulty walking or climbing stairs?: No Weakness of Legs: None Weakness of Arms/Hands: None  Home Assistive Devices/Equipment Home Assistive Devices/Equipment: None    Abuse/Neglect Assessment (Assessment to be complete while patient is alone) Physical Abuse: Denies Verbal Abuse: Denies Sexual Abuse: Denies Exploitation of patient/patient's resources: Denies Self-Neglect: Denies Values / Beliefs Cultural Requests During Hospitalization: None Spiritual Requests During Hospitalization: None Consults Spiritual Care Consult Needed: No Social Work Consult Needed: No Regulatory affairs officer (For Healthcare) Does patient have an advance directive?: No Would patient like information on creating an advanced directive?: No - patient declined information    Additional Information 1:1 In Past 12 Months?: No CIRT Risk: No Elopement Risk: No Does patient have medical clearance?: Yes     Disposition:  Disposition Initial Assessment Completed for this Encounter: Yes Disposition of Patient: Other dispositions Other disposition(s): Other (Comment) (Referred to [facility])  On Site Evaluation by:   Reviewed with Physician:    Graciella Freer [place] 12/01/2014 2:23 PM

## 2014-12-01 NOTE — Telephone Encounter (Signed)
Telephone call with patient after he had called to make an appointment and made vague suicidal ideation comments to reception.  Patient admitted he had been having suicidal thoughts over the past week, primarily related to increased problems managing his mood and dissatisfaction with work.  Patient denied any current plan or intent.  Reported he had no access to guns and thoughts he had in the past had been to take an overdose on OTC medication.  Patient reported he was safe at this time and denied any thoughts of wanting to harm himself today but does want to be evaluated and possibly started on medication.  Patient informed we could set him up for a first evaluation, now scheduled for 12/29/14 at 9am with Dr. Adele Schilder but if patient agreed he could come in to be evaluated at [facility] for an assessment for safety and recommendations.  Patient stated he would come into [facility] today to meet with the assessment department and stated he is safe with no plans to harm self, no intent and no plan.  Patient information given to TTS department and follow up appointment with our outpatient office.  Requested patient be evaluated for safety.  Patient agreed to call back if any problems coming into [facility] to be evaluated this date.

## 2014-12-19 ENCOUNTER — Encounter (HOSPITAL_COMMUNITY): Payer: Self-pay | Admitting: Psychiatry

## 2014-12-19 ENCOUNTER — Other Ambulatory Visit (HOSPITAL_COMMUNITY): Payer: 59 | Attending: Psychiatry | Admitting: Psychiatry

## 2014-12-19 DIAGNOSIS — R45851 Suicidal ideations: Secondary | ICD-10-CM | POA: Insufficient documentation

## 2014-12-19 DIAGNOSIS — F331 Major depressive disorder, recurrent, moderate: Secondary | ICD-10-CM | POA: Diagnosis not present

## 2014-12-19 DIAGNOSIS — F909 Attention-deficit hyperactivity disorder, unspecified type: Secondary | ICD-10-CM | POA: Diagnosis not present

## 2014-12-19 NOTE — Addendum Note (Signed)
Addended by: Sherlyn Hay on: 12/19/2014 03:21 PM   Modules accepted: Medications

## 2014-12-19 NOTE — Progress Notes (Signed)
    Daily Group Progress Note  Program: IOP  Group Time: 9:00-10:30  Participation Level: Active  Behavioral Response: Appropriate  Type of Therapy:  Group Therapy  Summary of Progress: Pt. Presented as talkative, engaged in group process. Pt. Discussed history of difficulty with workplace relationships and feeling that he is not treated fairly as compared to his co-workers. Pt. Shared history of problems with stress management and recently being troubled with suicidal thoughts. Pt. Reported positive, trusting relationship with his therapist who referred him to IOP.      Group Time: 10:30-12:00  Participation Level:  Active  Behavioral Response: Appropriate  Type of Therapy: Psycho-education Group  Summary of Progress: Pt. Met with case manager and Dr. Lovena Le for intake assessment.   Nancie Neas, LPC

## 2014-12-19 NOTE — Progress Notes (Signed)
Psychiatric Initial Adult Assessment   Patient Identification: AMJAD FIKES MRN:  [id number] Date of Evaluation:  12/19/2014 Referral Source: therapist, [name] Chief Complaint:   Chief Complaint    Depression; ADD; Anxiety; Stress     Visit Diagnosis:    ICD-9-CM ICD-10-CM   1. Major depressive disorder, recurrent episode, moderate (HCC) 296.32 F33.1    Diagnosis:   Patient Active Problem List   Diagnosis Date Noted  . ADD (attention deficit disorder) [F90.9] 04/18/2014   History of Present Illness:  Mr Oyama has been depressed off and on for   Many years he said.  Currently the depression has been there for months precipitated and aggravated by his job situation.  There are people who clash with him and  it seems to him that management cannot do anything about it so he feels hopeless at work.  He hates to go to work but he has been there for 10 years and the job itself is good, just not all the co workers.  His depression presents as hopelessness, sadness, crying spells, periodic suicidal thoughts, decreased energy, interest and motivation.  He has a history of ADHD and responded to medications in the past.  He does not take any meds currently and does not want to, although he still has symptoms of ADD.  He had a learning disability as a child but "grew out of it " he said.  His childhood was overall happy although he could not be close to his father probably because of the combination of the ADHD and LD.  Felt a failure and a disappointment.  Close to his mother then and now.  Has friends who are supportive and has spiritual beliefs that are supportive.   Elements:  Location:  depression. Quality:  daily sadness with some suicidal ideation at times. Severity:  as above. Timing:  job stress. Duration:  several months at least. Context:  as above. Associated Signs/Symptoms: Depression Symptoms:  depressed mood, anhedonia, fatigue, feelings of worthlessness/guilt, difficulty  concentrating, hopelessness, impaired memory, suicidal thoughts without plan, anxiety, loss of energy/fatigue, (Hypo) Manic Symptoms:  Impulsivity, Irritable Mood, Anxiety Symptoms:  Excessive Worry, Psychotic Symptoms:  none PTSD Symptoms: Negative  Past Medical History:  Past Medical History  Diagnosis Date  . ADD (attention deficit disorder)   . Depression     Past Surgical History  Procedure Laterality Date  . Cardiac catheterization     Family History:  Family History  Problem Relation Age of Onset  . Heart disease Sister 44    pacemaker  . Depression Brother   . Anxiety disorder Brother   . ADD / ADHD Brother   . Alcohol abuse Paternal Aunt    Social History:   Social History   Social History  . Marital Status: Single    Spouse Name: N/A  . Number of Children: N/A  . Years of Education: N/A   Social History Main Topics  . Smoking status: Never Smoker   . Smokeless tobacco: Never Used  . Alcohol Use: 0.6 oz/week    1 Glasses of wine per week  . Drug Use: No  . Sexual Activity: Not Currently   Other Topics Concern  . None   Social History Narrative   Additional Social History: none  Musculoskeletal: Strength & Muscle Tone: within normal limits Gait & Station: normal Patient leans: N/A  Psychiatric Specialty Exam: HPI  ROS  There were no vitals taken for this visit.There is no weight on file to calculate  BMI.  General Appearance: Fairly Groomed  Eye Contact:  Good  Speech:  Clear and Coherent  Volume:  Normal  Mood:  Depressed  Affect:  Congruent  Thought Process:  Coherent and Logical  Orientation:  Full (Time, Place, and Person)  Thought Content:  Negative  Suicidal Thoughts:  No  Homicidal Thoughts:  No  Memory:  Immediate;   Good Recent;   Good Remote;   Good  Judgement:  Fair  Insight:  Fair  Psychomotor Activity:  Normal  Concentration:  Good  Recall:  Good  Fund of Knowledge:Good  Language: Good  Akathisia:  Negative   Handed:  Right  AIMS (if indicated):  0  Assets:  Communication Skills Desire for Improvement Financial Resources/Insurance [place] Talents/Skills Transportation Vocational/Educational  ADL's:  Intact  Cognition: WNL  Sleep:  adequate   Is the patient at risk to self?  No. Has the patient been a risk to self in the past 6 months?  No. Has the patient been a risk to self within the distant past?  Yea 5 years ago Is the patient a risk to others?  No. Has the patient been a risk to others in the past 6 months?  No. Has the patient been a risk to others within the distant past?  No.  Allergies:   Allergies  Allergen Reactions  . Sulfa Antibiotics    Current Medications: Current Outpatient Prescriptions  Medication Sig Dispense Refill  . ibuprofen (ADVIL,MOTRIN) 800 MG tablet Take 1 tablet (800 mg total) by mouth 3 (three) times daily. 21 tablet 0   No current facility-administered medications for this visit.    Previous Psychotropic Medications: Yes   Substance Abuse History in the last 12 months:  No.  Consequences of Substance Abuse: Negative  Medical Decision Making:  Established Problem, Worsening (2)  Treatment Plan Summary: daily group therapy    Donnelly Angelica 11/1/20162:39 PM
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
## 2014-12-20 ENCOUNTER — Other Ambulatory Visit (HOSPITAL_COMMUNITY): Payer: 59 | Admitting: Psychiatry

## 2014-12-20 DIAGNOSIS — F331 Major depressive disorder, recurrent, moderate: Secondary | ICD-10-CM | POA: Diagnosis not present

## 2014-12-20 NOTE — Progress Notes (Signed)
    Daily Group Progress Note  Program: IOP  Group Time: 9:00-10:30  Participation Level: Active  Behavioral Response: Appropriate  Type of Therapy:  Group Therapy  Summary of Progress: Pt. Presented in group as talkative, brightened affect, mildly anxious. Pt. Appears to be challenged by recognizing social cues i.e., allowances for personal space and developing rapport by being other centered and use of effective eye contact.  Pt. Discussed relationship with his daughter, history of poor relationship with his daughter's mother. Pt. Also discussed current relationship with friend who he would like to develop romantic relationship, but is challenged to recognize and observe the friend's rejection his request for a romantic relationship.      Group Time: 10:30-12:00  Participation Level:  Active  Behavioral Response: Appropriate  Type of Therapy: Psycho-education Group  Summary of Progress: Pt. Was present during instruction regarding grounding series. Pt. Reported that he has tried breathing exercises but "they don't work for me". Pt. Was given suggestions about how he might try breathing exercises for a shortened time period, at the same time everyday, reminding himself throughout the day to focus his attention on his exhale, and to not make a goal for himself dismissing his thoughts, but developing a less judgmental attitude towards his thoughts.   Nancie Neas, LPC

## 2014-12-21 ENCOUNTER — Other Ambulatory Visit (HOSPITAL_COMMUNITY): Payer: 59 | Admitting: Psychiatry

## 2014-12-21 DIAGNOSIS — F331 Major depressive disorder, recurrent, moderate: Secondary | ICD-10-CM

## 2014-12-21 NOTE — Progress Notes (Signed)
    Daily Group Progress Note  Program: IOP  Group Time: 9:00-10:30  Participation Level: Active  Behavioral Response: Appropriate  Type of Therapy:  Group Therapy  Summary of Progress: Pt. Presents as talkative, generally low insight into interpersonal conflicts. Pt. Reports that he "hates his job", challenged by feelings of betrayal toward manager who sided with a temporary employee. Pt. Has difficult time understanding his personal responsibility toward outcomes in relationships. Pt. Processed desire for romantic relationship and difficulty pulling pack when romantic gestures are not received.     Group Time: 10:30-12:00  Participation Level:  Active  Behavioral Response: Appropriate  Type of Therapy: Psycho-education Group  Summary of Progress: Pt. Watched and discussed Cristie Hem video about development of self-compassion by applying mindfulness, words of kindness to self, and reminding self of common humanity i.e., imperfection.   Nancie Neas, LPC

## 2014-12-22 ENCOUNTER — Other Ambulatory Visit (HOSPITAL_COMMUNITY): Payer: 59 | Admitting: Psychiatry

## 2014-12-22 DIAGNOSIS — F331 Major depressive disorder, recurrent, moderate: Secondary | ICD-10-CM | POA: Diagnosis not present

## 2014-12-25 ENCOUNTER — Other Ambulatory Visit (HOSPITAL_COMMUNITY): Payer: 59 | Admitting: Psychiatry

## 2014-12-25 DIAGNOSIS — F331 Major depressive disorder, recurrent, moderate: Secondary | ICD-10-CM | POA: Diagnosis not present

## 2014-12-25 NOTE — Progress Notes (Signed)
    Daily Group Progress Note  Program: IOP  Group Time: 9:00-10:30  Participation Level: Active  Behavioral Response: Appropriate  Type of Therapy:  Psycho-education Group  Summary of Progress: Pt. Participated in medication management group with Jiles Garter.     Group Time: 10:30-12:00  Participation Level:  Active  Behavioral Response: Appropriate  Type of Therapy: Group Therapy  Summary of Progress: Pt. Was active and engaged in group process. Pt. Shared that his friend called at last minute and did not attend gala with him. Pt. Reported that he was disappointment, but was able to have a good time anyway. Pt. Processed feelings of disappointment and participated in discussion about developing healthy boundaries in the relationship so that he can heal and seek the meaningful connection that he wants.   Nancie Neas, LPC

## 2014-12-25 NOTE — Progress Notes (Signed)
    Daily Group Progress Note  Program: IOP  Group Time: 9:00-10:30  Participation Level: Active  Behavioral Response: Appropriate  Type of Therapy:  Group Therapy  Summary of Progress: Pt. Presents as talkative, sometimes tearful and angry, poor insight. Pt. Does not receive redirection well. Pt. Was asked by the counselor to hold his conversation with a group member until another group member had completed her check-in. Pt. Became angry as evidenced by eyes watered and turned red and glared at the counselor. Counselor used immediacy to recognize and process Pt.'s emotional response. Pt. Discussed feeling singled out by the group and was able to recognize that this was a pattern in his life that was impeding his ability to form effective relationships at work. Pt. Was given feedback from the group about use of medications to assist with his ability to effectively regulate his emotions and use the skills introduced in group.      Group Time: 10:30-12:00  Participation Level:  Active  Behavioral Response: Appropriate  Type of Therapy: Psycho-education Group  Summary of Progress: Pt. Participated in grief and loss facilitated by Jeanella Craze. Pt. Shared loss of his grandmother who was a loving figure in his life. Pt. Discussed process of taking the time that he needed to grieve so that he could think about the happy memories with her.   Nancie Neas, LPC

## 2014-12-26 ENCOUNTER — Other Ambulatory Visit (HOSPITAL_COMMUNITY): Payer: 59 | Admitting: Psychiatry

## 2014-12-26 DIAGNOSIS — F331 Major depressive disorder, recurrent, moderate: Secondary | ICD-10-CM

## 2014-12-26 NOTE — Progress Notes (Signed)
    Daily Group Progress Note  Program: IOP  Group Time: 9:00-10:30  Participation Level: Active  Behavioral Response: Appropriate  Type of Therapy:  Group Therapy  Summary of Progress: Pt. Presents as talkative, supportive of group members, demonstrates poor insight about behaviors. Pt. Reports that he is creating emotional distance in relationship with friend. Pt. 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      Group Time: 10:30-12:00  Participation Level:  Active  Behavioral Response: Appropriate  Type of Therapy: Psycho-education Group  Summary of Progress: Pt. Participated in discussion about developing healthy relationships, developing healthy boundaries and emotional distance to gain clarity about self.   Nancie Neas, LPC

## 2014-12-27 ENCOUNTER — Other Ambulatory Visit (HOSPITAL_COMMUNITY): Payer: 59 | Admitting: Psychiatry

## 2014-12-27 VITALS — BP 118/78 | HR 86

## 2014-12-27 DIAGNOSIS — F331 Major depressive disorder, recurrent, moderate: Secondary | ICD-10-CM

## 2014-12-28 ENCOUNTER — Other Ambulatory Visit (HOSPITAL_COMMUNITY): Payer: 59

## 2014-12-28 ENCOUNTER — Other Ambulatory Visit (HOSPITAL_COMMUNITY): Payer: 59 | Admitting: Psychiatry

## 2014-12-28 DIAGNOSIS — F331 Major depressive disorder, recurrent, moderate: Secondary | ICD-10-CM | POA: Diagnosis not present

## 2014-12-28 NOTE — Progress Notes (Signed)
    Daily Group Progress Note  Program: IOP  Group Time: 9:00-10:30  Participation Level: Active  Behavioral Response: Appropriate  Type of Therapy:  Group Therapy  Summary of Progress: Pt. Continues to present as talkative, engaged in the group process, attempts to apply the groups feedback about behaviors that are holding him back in relationships. Pt. Has made the decision to talk to his employer about seeking short-term disability. Pt. Shared his beliefs about accepting people and new experiences with "open arms".     Group Time: 10:30-12:00  Participation Level:  Active  Behavioral Response: Appropriate  Type of Therapy: Psycho-education Group  Summary of Progress: Pt. Watched and discussed Almond Lint video about developing vulnerability as resilience to shame and essential for development of relationships.   Nancie Neas, LPC

## 2014-12-29 ENCOUNTER — Other Ambulatory Visit (HOSPITAL_COMMUNITY): Payer: 59 | Admitting: Psychiatry

## 2014-12-29 ENCOUNTER — Ambulatory Visit (HOSPITAL_COMMUNITY): Payer: Self-pay | Admitting: Psychiatry

## 2014-12-29 DIAGNOSIS — F331 Major depressive disorder, recurrent, moderate: Secondary | ICD-10-CM | POA: Diagnosis not present

## 2015-01-01 ENCOUNTER — Other Ambulatory Visit (HOSPITAL_COMMUNITY): Payer: 59 | Admitting: Psychiatry

## 2015-01-01 DIAGNOSIS — F331 Major depressive disorder, recurrent, moderate: Secondary | ICD-10-CM

## 2015-01-01 NOTE — Progress Notes (Signed)
    Daily Group Progress Note  Program: IOP  Group Time: 9:00-10:30  Participation Level: Active  Behavioral Response: Appropriate  Type of Therapy:  Group Therapy  Summary of Progress: Pt. Presents as talkative, engaged in group process, is trying very hard to apply the group's feedback. Pt. Reports that he is continuing to go to work and is following up with his human resources about pursuing short term disability. Pt. Also reports that he is trying to set new relationship boundaries with his friend. Pt. Discussed previous group theme of vulnerability and was able to discuss how the concept is helping him to acknowledge where he has made mistakes in relationships.      Group Time: 10:30-12:00  Participation Level:  Active  Behavioral Response: Appropriate  Type of Therapy: Psycho-education Group  Summary of Progress: Pt. Participated in discussion about developing vulnerability, setting healthy boundaries, and importance of self-care behaviors.   Nancie Neas, LPC

## 2015-01-01 NOTE — Progress Notes (Signed)
    Daily Group Progress Note  Program: IOP  Group Time: 9:00-10:30  Participation Level: Active  Behavioral Response: Appropriate  Type of Therapy:  Group Therapy  Summary of Progress: Pt. Presents as talkative, alert, desire to apply the group's feedback to his life, at times with poor insight. Pt. Discussed his sleep schedule which is a problem due to currently working second shift. Pt. Reports that he is currently working 3-11 pm and goes to sleep around 2am and sleeps approximately 6 hours. Pt. Reported that he felt sleepy, but that his mood was good. Pt. Expressed excitement about getting to spend time with his daughter over the weekend.      Group Time: 10:30-12:00  Participation Level:  Active  Behavioral Response: Appropriate  Type of Therapy: Psycho-education Group  Summary of Progress: Pt. Participated in grief and loss group. Pt. Discussed loss of his relationship with his friend, and necessity of grieving loss of his hope for the relationship.   Nancie Neas, LPC

## 2015-01-02 ENCOUNTER — Other Ambulatory Visit (HOSPITAL_COMMUNITY): Payer: 59 | Admitting: Psychiatry

## 2015-01-02 DIAGNOSIS — F331 Major depressive disorder, recurrent, moderate: Secondary | ICD-10-CM | POA: Diagnosis not present

## 2015-01-02 NOTE — Patient Instructions (Signed)
Patient completed MH-IOP today.  Will follow up with [name], LPC possibly next week.  Encouraged support groups.

## 2015-01-02 NOTE — Progress Notes (Signed)
Patient ID: [name], male   DOB: Jul 27, 1980, 34 y.o.   MRN: [id number] Discharge Note  Patient:  Paul [name] is an 34 y.o., male DOB:  [birth year]  Date of Admission:  12/20/2014  Date of Discharge:  01/02/2015  Reason for Admission:depression and anxiety  IOP Course:  Mr Vanneste attended group and participated.  Said that he got a lot from the experience in that he would go to work after the group and practice the coping skills he had learned.  Michela Pitcher he accepts that work is just the way it is and he can learn to deal with the different personalities better.  Is handling the breakup with his girlfriend okay he believes.  Glad for the experience and hopes he can come back if he needs to in the future. Depression is not every day and he still prefers to not be on medications.  Mental Status at [place] mild and not every day.  Anxiety much improved  Lab Results: No results found for this or any previous visit (from the past 48 hour(s)).   Current outpatient prescriptions:  .  ibuprofen (ADVIL,MOTRIN) 800 MG tablet, Take 1 tablet (800 mg total) by mouth 3 (three) times daily., Disp: 21 tablet, Rfl: 0  Axis Diagnosis:  Major depression, recurrent mild   Level of Care:  IOP  Discharge destination:  Other:  has appointement with his therapist, does not need a psychiatrist at this point  Is patient on multiple antipsychotic therapies at discharge:  No    Has Patient had three or more failed trials of antipsychotic monotherapy by history:  Negative  Patient phone:  [phone] (home)  Patient address:   [place] [address],   Follow-up recommendations:  Activity:  continue current activity Diet:  continue current diet  Comments:  none  The patient received suicide prevention pamphlet:  Yes Belongings returned:  na  Donnelly Angelica 01/02/2015, 9:54 AM

## 2015-01-02 NOTE — Progress Notes (Signed)
    Daily Group Progress Note  Program: IOP  Group Time: 9:00-10:30  Participation Level: Active  Behavioral Response: Appropriate  Type of Therapy:  Psycho-education Group  Summary of Progress: Pt. Participated in medication management group with Jiles Garter.      Group Time: 10:30-12:00  Participation Level:  Active  Behavioral Response: Appropriate  Type of Therapy: Group Therapy  Summary of Progress: Pt. Continued to report good mood, feels hopeful about his future. Pt. Reported that he had a good weekend and spent quality time with his daughter. Pt. Reported that he talked to his friend, continues to be challenged to set healthy boundaries in relationships.   Nancie Neas, LPC

## 2015-01-02 NOTE — Progress Notes (Signed)
    Daily Group Progress Note  Program: IOP  Group Time: 9:00-10:30  Participation Level: Active  Behavioral Response: Appropriate  Type of Therapy:  Psycho-education Group  Summary of Progress: Pt. Presented with bright affect, talkative, engaged in group process, and open to feedback from the group. Pt. Participated in discharge planning with Velva Harman. Pt. Communicated readiness to discharge, ability to better cope with difficult emotions, and plan to return to work and taking time off as needed to help with stress management.      Group Time: 10:30-12:00  Participation Level:  Active  Behavioral Response: Appropriate  Type of Therapy: Group Therapy  Summary of Progress: Pt. 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 Pt. Discussed excitement about spending time with his family over the holidays and discussed the challenge of managing his anger towards his brother. Pt. Participated in discussion about using grounding exercises, focusing on sensory awareness, and use of mindfulness i.e., focusing on present moment awareness to cope with negative emotions such as anger.   Nancie Neas, LPC

## 2015-01-02 NOTE — Progress Notes (Signed)
Paul [name] is a 34 y.o. , single, employed, Caucasian male, who was referred per therapist of ~ ten years [name], [place]); treatment for worsening depressive symptoms with SI. Symptoms included: Poor concentration, mood swings, poor self-esteem, irritability, isolation, anhedonia, sadness, poor sleep, ruminating thoughts, low energy, and no motivation. Pt denied SI/HI or A/V hallucinations. Pt is well known to writer due to being in [place] five years ago. Denied any prior psychiatric inpatient hospitalizations. Denied any prior suicide attempts. 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 States he transferred to a new facility, which is located in [place], [place] in June 2016. Recently transferred to a different department, which is less stressful. States he had conflict with a previous coworker in his past department. According to pt, he is having conflict with another temporary coworker. 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 "It is a very hostile environment and a lot of favoritism is shown with lack of praise." Pt reports that he worked 28 days straight before he started to decompensate. Pt states he has a month old company ([employer]) in which he started. 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 Pt hopes to excel in his company so he can quit [employer] eventually. Pt completed MH-IOP today.  Reports mood is stabilizing.  "I feel more at peace."  Pt has been applying the coping skills he's learned.  Able to cope better at work.  Reports he will take breaks at work.  States that the groups are helpful.  Denies SI/HI or A/V hallucinations.  A:  D/C today.  Will follow up with [name], LPC next week.  R:  Pt receptive.      [name], RITA, M.Ed, CNA

## 2015-01-03 ENCOUNTER — Other Ambulatory Visit (HOSPITAL_COMMUNITY): Payer: 59

## 2015-01-04 ENCOUNTER — Other Ambulatory Visit (HOSPITAL_COMMUNITY): Payer: 59

## 2015-01-05 ENCOUNTER — Other Ambulatory Visit (HOSPITAL_COMMUNITY): Payer: 59

## 2015-01-08 ENCOUNTER — Other Ambulatory Visit (HOSPITAL_COMMUNITY): Payer: 59

## 2015-01-09 ENCOUNTER — Other Ambulatory Visit (HOSPITAL_COMMUNITY): Payer: 59

## 2015-01-10 ENCOUNTER — Other Ambulatory Visit (HOSPITAL_COMMUNITY): Payer: 59

## 2015-01-12 ENCOUNTER — Other Ambulatory Visit (HOSPITAL_COMMUNITY): Payer: 59

## 2015-01-15 ENCOUNTER — Other Ambulatory Visit (HOSPITAL_COMMUNITY): Payer: 59

## 2015-01-16 ENCOUNTER — Other Ambulatory Visit (HOSPITAL_COMMUNITY): Payer: 59

## 2015-01-17 ENCOUNTER — Other Ambulatory Visit (HOSPITAL_COMMUNITY): Payer: 59

## 2015-01-18 ENCOUNTER — Other Ambulatory Visit (HOSPITAL_COMMUNITY): Payer: 59

## 2015-01-19 ENCOUNTER — Other Ambulatory Visit (HOSPITAL_COMMUNITY): Payer: 59

## 2015-01-22 ENCOUNTER — Other Ambulatory Visit (HOSPITAL_COMMUNITY): Payer: 59

## 2015-01-23 ENCOUNTER — Other Ambulatory Visit (HOSPITAL_COMMUNITY): Payer: 59

## 2015-01-24 ENCOUNTER — Other Ambulatory Visit (HOSPITAL_COMMUNITY): Payer: 59

## 2015-01-25 ENCOUNTER — Other Ambulatory Visit (HOSPITAL_COMMUNITY): Payer: 59

## 2015-01-26 ENCOUNTER — Other Ambulatory Visit (HOSPITAL_COMMUNITY): Payer: 59

## 2015-01-29 ENCOUNTER — Other Ambulatory Visit (HOSPITAL_COMMUNITY): Payer: 59

## 2015-01-30 ENCOUNTER — Other Ambulatory Visit (HOSPITAL_COMMUNITY): Payer: 59

## 2015-01-31 ENCOUNTER — Other Ambulatory Visit (HOSPITAL_COMMUNITY): Payer: 59

## 2015-02-01 ENCOUNTER — Other Ambulatory Visit (HOSPITAL_COMMUNITY): Payer: 59

## 2015-02-02 ENCOUNTER — Other Ambulatory Visit (HOSPITAL_COMMUNITY): Payer: 59

## 2015-02-05 ENCOUNTER — Other Ambulatory Visit (HOSPITAL_COMMUNITY): Payer: 59

## 2015-02-06 ENCOUNTER — Other Ambulatory Visit (HOSPITAL_COMMUNITY): Payer: 59

## 2015-04-26 ENCOUNTER — Encounter: Payer: Self-pay | Admitting: Family Medicine

## 2015-04-26 ENCOUNTER — Ambulatory Visit (INDEPENDENT_AMBULATORY_CARE_PROVIDER_SITE_OTHER): Payer: 59 | Admitting: Family Medicine

## 2015-04-26 VITALS — BP 116/68 | HR 64 | Ht 68.0 in | Wt 150.2 lb

## 2015-04-26 DIAGNOSIS — Z23 Encounter for immunization: Secondary | ICD-10-CM

## 2015-04-26 DIAGNOSIS — Z209 Contact with and (suspected) exposure to unspecified communicable disease: Secondary | ICD-10-CM | POA: Diagnosis not present

## 2015-04-26 DIAGNOSIS — F909 Attention-deficit hyperactivity disorder, unspecified type: Secondary | ICD-10-CM | POA: Diagnosis not present

## 2015-04-26 DIAGNOSIS — F341 Dysthymic disorder: Secondary | ICD-10-CM | POA: Diagnosis not present

## 2015-04-26 DIAGNOSIS — F988 Other specified behavioral and emotional disorders with onset usually occurring in childhood and adolescence: Secondary | ICD-10-CM

## 2015-04-26 DIAGNOSIS — Z0000 Encounter for general adult medical examination without abnormal findings: Secondary | ICD-10-CM

## 2015-04-26 LAB — POCT URINALYSIS DIPSTICK
Bilirubin, UA: NEGATIVE
Glucose, UA: NEGATIVE
Ketones, UA: NEGATIVE
Leukocytes, UA: NEGATIVE
NITRITE UA: NEGATIVE
PROTEIN UA: NEGATIVE
RBC UA: NEGATIVE
UROBILINOGEN UA: NEGATIVE
pH, UA: 6

## 2015-04-26 NOTE — Progress Notes (Signed)
Subjective:    Patient ID: Paul [name], male    DOB: 12-31-1980, 35 y.o.   MRN: [id number]  HPI He is here for complete examination. He did have recent HIV testing done elsewhere which was negative. He is sexually active with women and does use condoms. He would like further testing and to be safe. He has underlying ADD but presently this is not giving him much difficulty. In September he did have difficulty with anxiety and depression and did have suicidal ideation. He was eventually seen at behavioral health but presently is no longer involved with him. He does admit to having a lifelong history of difficulty with intermittent bouts of depression. He is interested in getting further health concerning this. He does keep himself physically active. Does not smoke. Family and social history is otherwise unchanged. His work keeps him busy working second shift which he states doesn't appear with his socializing. He exercises regularly but does not run anymore.   Review of Systems  All other systems reviewed and are negative.      Objective:   Physical Exam BP 116/68 mmHg  Pulse 64  Ht 5\' 8"  (1.727 m)  Wt 150 lb 3.2 oz (68.13 kg)  BMI 22.84 kg/m2  General Appearance:    Alert, cooperative, no distress, appears stated age  Head:    Normocephalic, without obvious abnormality, atraumatic  Eyes:    PERRL, conjunctiva/corneas clear, EOM's intact, fundi    benign  Ears:    Normal TM's and external ear canals  Nose:   Nares normal, mucosa normal, no drainage or sinus   tenderness  Throat:   Lips, mucosa, and tongue normal; teeth and gums normal  Neck:   Supple, no lymphadenopathy;  thyroid:  no   enlargement/tenderness/nodules; no carotid   bruit or JVD  Back:    Spine nontender, no curvature, ROM normal, no CVA     tenderness  Lungs:     Clear to auscultation bilaterally without wheezes, rales or     ronchi; respirations unlabored  Chest Wall:    No tenderness or deformity   Heart:     Regular rate and rhythm, S1 and S2 normal, no murmur, rub   or gallop  Breast Exam:    No chest wall tenderness, masses or gynecomastia  Abdomen:     Soft, non-tender, nondistended, normoactive bowel sounds,    no masses, no hepatosplenomegaly  Genitalia:    Normal male external genitalia without lesions.  Testicles without masses.  No inguinal hernias.  Rectal:   Deferred due to age <40 and lack of symptoms  Extremities:   No clubbing, cyanosis or edema  Pulses:   2+ and symmetric all extremities  Skin:   Skin color, texture, turgor normal, no rashes or lesions  Lymph nodes:   Cervical, supraclavicular, and axillary nodes normal  Neurologic:   CNII-XII intact, normal strength, sensation and gait; reflexes 2+ and symmetric throughout          Psych:   Normal mood, affect, hygiene and grooming.          Assessment & Plan:  Routine general medical examination at a health care facility - Plan: POCT urinalysis dipstick  Contact with or exposure to communicable disease - Plan: GC/chlamydia probe amp, urine, RPR  ADD (attention deficit disorder)  Dysthymia  Need for prophylactic vaccination with combined diphtheria-tetanus-pertussis (DTP) vaccine - Plan: Tdap vaccine greater than or equal to 7yo IM I discussed the fact that he probably  does indeed have dysthymia. Discussed possible medication versus counseling. At this point he is not interested in medication but would like to get involved with further counseling which I strongly encouraged. Gave him the phone number for [employer].

## 2015-04-26 NOTE — Patient Instructions (Signed)
Paul [name] [phone] 

## 2015-04-27 LAB — RPR

## 2015-04-27 LAB — GC/CHLAMYDIA PROBE AMP
CT PROBE, AMP APTIMA: NOT DETECTED
GC PROBE AMP APTIMA: NOT DETECTED

## 2016-03-13 ENCOUNTER — Encounter: Payer: Self-pay | Admitting: Family Medicine

## 2016-03-13 ENCOUNTER — Ambulatory Visit (INDEPENDENT_AMBULATORY_CARE_PROVIDER_SITE_OTHER): Payer: 59 | Admitting: Family Medicine

## 2016-03-13 VITALS — BP 130/70 | HR 94 | Wt 155.0 lb

## 2016-03-13 DIAGNOSIS — Z209 Contact with and (suspected) exposure to unspecified communicable disease: Secondary | ICD-10-CM

## 2016-03-13 NOTE — Addendum Note (Signed)
Addended by: Denita Lung on: 03/13/2016 04:08 PM   Modules accepted: Orders

## 2016-03-13 NOTE — Progress Notes (Signed)
   Subjective:    Patient ID: Paul [name], male    DOB: 22-Jul-1980, 36 y.o.   MRN: [id number]  HPI He is here for evaluation of possible STD is posterior. Last night he did have sexual activity with a woman that involved oral sex on him. No exchange of body fluid occurred. Previous sexual activity was approximately 6 months ago.   Review of Systems     Objective:   Physical Exam alert and in no distress. Exam of the penis does show a mole present on the distal shaft dorsal surface.      Assessment & Plan:  Contact with or exposure to communicable disease - Plan: GC/chlamydia probe amp, urine, HIV antibody, RPR Reassured him that the moles nothing to be concerned about. Condoms were given to him. Discussed always using condoms with sexual activity. Of note is that he states that he is doing much better psychologically and is in a much happier state. Last year was quite difficult with his younger brother dying and father having a heart attack. He seems to handle that fairly well.

## 2016-03-14 LAB — RPR

## 2016-03-14 LAB — HIV ANTIBODY (ROUTINE TESTING W REFLEX): HIV: NONREACTIVE

## 2016-03-14 LAB — GC/CHLAMYDIA PROBE AMP
CT Probe RNA: NOT DETECTED
GC Probe RNA: NOT DETECTED

## 2016-05-12 ENCOUNTER — Encounter: Payer: 59 | Admitting: Family Medicine

## 2016-12-25 DIAGNOSIS — D225 Melanocytic nevi of trunk: Secondary | ICD-10-CM | POA: Diagnosis not present

## 2016-12-25 DIAGNOSIS — D2261 Melanocytic nevi of right upper limb, including shoulder: Secondary | ICD-10-CM | POA: Diagnosis not present

## 2016-12-25 DIAGNOSIS — L218 Other seborrheic dermatitis: Secondary | ICD-10-CM | POA: Diagnosis not present

## 2017-04-02 ENCOUNTER — Encounter: Payer: Self-pay | Admitting: Family Medicine

## 2017-04-02 ENCOUNTER — Ambulatory Visit (INDEPENDENT_AMBULATORY_CARE_PROVIDER_SITE_OTHER): Payer: 59 | Admitting: Family Medicine

## 2017-04-02 VITALS — BP 120/80 | HR 74 | Temp 97.8°F | Wt 159.8 lb

## 2017-04-02 DIAGNOSIS — R05 Cough: Secondary | ICD-10-CM

## 2017-04-02 DIAGNOSIS — Z113 Encounter for screening for infections with a predominantly sexual mode of transmission: Secondary | ICD-10-CM | POA: Diagnosis not present

## 2017-04-02 DIAGNOSIS — R058 Other specified cough: Secondary | ICD-10-CM

## 2017-04-02 NOTE — Patient Instructions (Signed)
Go get your chest XR at [facility].   Try taking over the counter Claritin, Allegra, Zyrtec, or Xyzal once daily and see if this helps with the cough.   We will call you with your results.

## 2017-04-02 NOTE — Progress Notes (Signed)
Chief Complaint  Patient presents with  . bad cough    bad cough during the day and night, been going on for a couple months.    Subjective:  Paul [name] is a 37 y.o. male who presents for a 4-5 month history of a dry cough. States it is not worsening. No associated symptoms.  Does not smoke. Denies underlying asthma, allergies or reflux.   States he works in a [employer] and there is a lot of dust there. Denies rhinorrhea, nasal congestion, watery eyes, sneezing, sore throat.   Denies fever, chills, unexplained weight loss, fatigue, chest pain, palpitations, shortness of breath, wheezing, N/V/D.   He would also like to have STD testing done.    Treatment to date: none.  Denies sick contacts.  No other aggravating or relieving factors.  No other c/o.  ROS as in subjective.   Objective: Vitals:   04/02/17 0922  BP: 120/80  Pulse: 74  Temp: 97.8 F (36.6 C)    General appearance: Alert, WD/WN, no distress, mildly ill appearing                             Skin: warm, no rash                           Head: no sinus tenderness                            Eyes: conjunctiva normal, corneas clear, PERRLA                            Ears: pearly TMs, external ear canals normal                          Nose: septum midline, turbinates swollen, with erythema and clear discharge             Mouth/throat: MMM, tongue normal, mild pharyngeal erythema                           Neck: supple, no adenopathy, no thyromegaly, nontender                          Heart: RRR, normal S1, S2, no murmurs                         Lungs: CTA bilaterally, no wheezes, rales, or rhonchi      Assessment: Cough present for greater than 3 weeks - Plan: DG Chest 2 View  Screen for STD (sexually transmitted disease) - Plan: RPR, HIV antibody, GC/Chlamydia Probe Amp    Plan: Discussed chronic cough and possible etiologies. Will send him for a chest XR and have him try an OTC antihistamine for now.  No  sign of an infectious process or cardiopulmonary etiology.STD testing done per patient request. He will follow up if cough is not improving.

## 2017-04-03 LAB — GC/CHLAMYDIA PROBE AMP
CHLAMYDIA, DNA PROBE: NEGATIVE
NEISSERIA GONORRHOEAE BY PCR: NEGATIVE

## 2017-04-03 LAB — RPR: RPR Ser Ql: NONREACTIVE

## 2017-04-03 LAB — HIV ANTIBODY (ROUTINE TESTING W REFLEX): HIV Screen 4th Generation wRfx: NONREACTIVE

## 2017-04-06 ENCOUNTER — Telehealth: Payer: Self-pay | Admitting: Family Medicine

## 2017-04-06 NOTE — Telephone Encounter (Signed)
Pt called for lab result.  Gave same

## 2017-05-15 DIAGNOSIS — L989 Disorder of the skin and subcutaneous tissue, unspecified: Secondary | ICD-10-CM | POA: Diagnosis not present

## 2017-05-19 ENCOUNTER — Ambulatory Visit (INDEPENDENT_AMBULATORY_CARE_PROVIDER_SITE_OTHER): Payer: 59 | Admitting: Family Medicine

## 2017-05-19 ENCOUNTER — Encounter: Payer: Self-pay | Admitting: Family Medicine

## 2017-05-19 VITALS — BP 112/73 | HR 83 | Temp 97.9°F | Ht 68.0 in | Wt 155.6 lb

## 2017-05-19 DIAGNOSIS — Z0000 Encounter for general adult medical examination without abnormal findings: Secondary | ICD-10-CM

## 2017-05-19 DIAGNOSIS — F410 Panic disorder [episodic paroxysmal anxiety] without agoraphobia: Secondary | ICD-10-CM

## 2017-05-19 DIAGNOSIS — F988 Other specified behavioral and emotional disorders with onset usually occurring in childhood and adolescence: Secondary | ICD-10-CM

## 2017-05-19 DIAGNOSIS — Z566 Other physical and mental strain related to work: Secondary | ICD-10-CM | POA: Diagnosis not present

## 2017-05-19 LAB — POCT URINALYSIS DIP (PROADVANTAGE DEVICE)
Bilirubin, UA: NEGATIVE
Glucose, UA: NEGATIVE mg/dL
Ketones, POC UA: NEGATIVE mg/dL
Leukocytes, UA: NEGATIVE
NITRITE UA: NEGATIVE
Protein Ur, POC: NEGATIVE mg/dL
RBC UA: NEGATIVE
Specific Gravity, Urine: 1.01
Urobilinogen, Ur: 3.5
pH, UA: 6 (ref 5.0–8.0)

## 2017-05-19 MED ORDER — ALPRAZOLAM 0.25 MG PO TABS: 0.2500 mg | ORAL_TABLET | Freq: Two times a day (BID) | ORAL | 0 refills | Status: DC | PRN

## 2017-05-19 NOTE — Progress Notes (Signed)
Subjective:    Patient ID: Paul [name], male    DOB: 08-26-1980, 37 y.o.   MRN: [id number]  HPI He is here for complete exam.  He is here complaining mainly of difficulty with anxiety attacks that tend to occur at work.  He states that sometimes taking up to a level 8 which causes him to shut down.  Recently things got better when he switched to a different part of the company.  He would like to get this under better control.  He did see [name] in the past but apparently had trouble getting in with her.  He also has had difficulty with anxiety attacks in other situations but usually mainly work-related.  He admits to having difficulty with sleep as well as being unable to shut his brain off at night.  He does have underlying ADD but presently is on no medication.  He has no other concerns or complaints.  He does have a 49 year old daughter that is now living with his parents.  Family and social history as well as health maintenance and immunizations was reviewed.   Review of Systems  All other systems reviewed and are negative.      Objective:   Physical Exam BP 112/73 (BP Location: Left Arm, Patient Position: Sitting)   Pulse 83   Temp 97.9 F (36.6 C)   Ht 5\' 8"  (1.727 m)   Wt 155 lb 9.6 oz (70.6 kg)   SpO2 97%   BMI 23.66 kg/m   General Appearance:    Alert, cooperative, no distress, appears stated age  Head:    Normocephalic, without obvious abnormality, atraumatic  Eyes:    PERRL, conjunctiva/corneas clear, EOM's intact, fundi    benign  Ears:    Normal TM's and external ear canals  Nose:   Nares normal, mucosa normal, no drainage or sinus   tenderness  Throat:   Lips, mucosa, and tongue normal; teeth and gums normal  Neck:   Supple, no lymphadenopathy;  thyroid:  no   enlargement/tenderness/nodules; no carotid   bruit or JVD     Lungs:     Clear to auscultation bilaterally without wheezes, rales or     ronchi; respirations unlabored      Heart:    Regular rate  and rhythm, S1 and S2 normal, no murmur, rub   or gallop     Abdomen:     Soft, non-tender, nondistended, normoactive bowel sounds,    no masses, no hepatosplenomegaly  Genitalia:    Normal male external genitalia without lesions.  Testicles without masses.  No inguinal hernias.  Rectal:   Deferred due to age <40 and lack of symptoms  Extremities:   No clubbing, cyanosis or edema  Pulses:   2+ and symmetric all extremities  Skin:   Skin color, texture, turgor normal, no rashes or lesions  Lymph nodes:   Cervical, supraclavicular, and axillary nodes normal  Neurologic:   CNII-XII intact, normal strength, sensation and gait; reflexes 2+ and symmetric throughout          Psych:   Normal mood, affect, hygiene and grooming.          Assessment & Plan:  Routine general medical examination at a health care facility  Attention deficit disorder, unspecified hyperactivity presence  Panic attacks - Plan: ALPRAZolam (XANAX) 0.25 MG tablet  Work stress I will give him some Xanax to help when it reaches a certain level to help him out with that.  He  is also to get involved in counseling to learn better ways to handle stress and also help with learning how to shut his mind down and get a good nights rest.  I will monitor this based on the amount of Xanax that he uses. He also had blood work drawn at work.  I have asked him to get a copy and send it to [place].

## 2017-05-19 NOTE — Addendum Note (Signed)
Addended by: Elyse Jarvis on: 05/19/2017 04:39 PM   Modules accepted: Orders

## 2017-05-21 ENCOUNTER — Ambulatory Visit (INDEPENDENT_AMBULATORY_CARE_PROVIDER_SITE_OTHER): Payer: 59 | Admitting: Psychology

## 2017-05-21 DIAGNOSIS — F411 Generalized anxiety disorder: Secondary | ICD-10-CM | POA: Diagnosis not present

## 2017-06-02 DIAGNOSIS — B079 Viral wart, unspecified: Secondary | ICD-10-CM | POA: Diagnosis not present

## 2017-09-25 DIAGNOSIS — H9209 Otalgia, unspecified ear: Secondary | ICD-10-CM | POA: Diagnosis not present

## 2017-09-25 DIAGNOSIS — H6120 Impacted cerumen, unspecified ear: Secondary | ICD-10-CM | POA: Diagnosis not present

## 2017-12-30 ENCOUNTER — Ambulatory Visit (INDEPENDENT_AMBULATORY_CARE_PROVIDER_SITE_OTHER): Payer: 59 | Admitting: Family Medicine

## 2017-12-30 ENCOUNTER — Encounter: Payer: Self-pay | Admitting: Family Medicine

## 2017-12-30 VITALS — BP 112/76 | HR 87 | Temp 97.6°F | Wt 150.8 lb

## 2017-12-30 DIAGNOSIS — H6121 Impacted cerumen, right ear: Secondary | ICD-10-CM | POA: Diagnosis not present

## 2017-12-30 DIAGNOSIS — Z113 Encounter for screening for infections with a predominantly sexual mode of transmission: Secondary | ICD-10-CM

## 2017-12-30 DIAGNOSIS — H6123 Impacted cerumen, bilateral: Secondary | ICD-10-CM

## 2017-12-30 DIAGNOSIS — K120 Recurrent oral aphthae: Secondary | ICD-10-CM

## 2017-12-30 DIAGNOSIS — N489 Disorder of penis, unspecified: Secondary | ICD-10-CM

## 2017-12-30 DIAGNOSIS — H6122 Impacted cerumen, left ear: Secondary | ICD-10-CM | POA: Diagnosis not present

## 2017-12-30 NOTE — Progress Notes (Signed)
   Subjective:    Patient ID: Paul [name], male    DOB: 03/26/1980, 37 y.o.   MRN: [id number]  HPI He is here for multiple issues.  He did have unprotected sex approximately 1 week ago but presently is having no dysuria, frequency, discharge.  He does have some lesions on his penis that he like me look at.  He also complains of bilateral earache but no fever, chills, sore throat.  Does complain of some mouth ulcers that he does get periodically.   Review of Systems     Objective:   Physical Exam Alert and in no distress.  Exam of mouth does show 2 aphthous ulcers.  No other lesions are noted.  Neck is supple without adenopathy.  TMs difficult to see due to cerumen.  Exam of the penis does show a moles present on the shaft of the penis.       Assessment & Plan:  Screen for STD (sexually transmitted disease) - Plan: HIV Antibody (routine testing w rflx), GC/Chlamydia Probe Amp, RPR  Aphthous ulcer  Penile lesion  Bilateral impacted cerumen Discussed the need for him to always wear condoms. Discussed treatment of aphthous ulcer and the possibility of using Kenalog and Orabase however he declined that. Discussed the fact that the penile lesions can be removed when he is ready. He will use Cerumenex and return here in 2 days for lavage as we are unable to revise them out today.

## 2017-12-30 NOTE — Patient Instructions (Signed)
Use Debrox and come back on Friday.

## 2017-12-30 NOTE — Addendum Note (Signed)
Addended by: Elyse Jarvis on: 12/30/2017 05:05 PM   Modules accepted: Orders

## 2017-12-31 LAB — RPR: RPR: NONREACTIVE

## 2017-12-31 LAB — HIV ANTIBODY (ROUTINE TESTING W REFLEX): HIV Screen 4th Generation wRfx: NONREACTIVE

## 2018-01-01 ENCOUNTER — Telehealth: Payer: Self-pay

## 2018-01-01 LAB — GC/CHLAMYDIA PROBE AMP
Chlamydia trachomatis, NAA: NEGATIVE
Neisseria gonorrhoeae by PCR: NEGATIVE

## 2018-01-01 NOTE — Telephone Encounter (Signed)
LVM to check on his ears. If pt is not in schedule please make appt . [place]

## 2018-01-07 ENCOUNTER — Ambulatory Visit: Payer: 59 | Admitting: Family Medicine

## 2018-05-21 ENCOUNTER — Encounter: Payer: 59 | Admitting: Family Medicine

## 2018-06-25 ENCOUNTER — Other Ambulatory Visit: Payer: Self-pay

## 2018-06-25 ENCOUNTER — Ambulatory Visit (INDEPENDENT_AMBULATORY_CARE_PROVIDER_SITE_OTHER): Payer: 59 | Admitting: Family Medicine

## 2018-06-25 ENCOUNTER — Encounter: Payer: Self-pay | Admitting: Family Medicine

## 2018-06-25 VITALS — BP 110/60 | HR 68 | Temp 98.4°F | Resp 16 | Wt 151.6 lb

## 2018-06-25 DIAGNOSIS — Z8249 Family history of ischemic heart disease and other diseases of the circulatory system: Secondary | ICD-10-CM

## 2018-06-25 DIAGNOSIS — R0789 Other chest pain: Secondary | ICD-10-CM

## 2018-06-25 DIAGNOSIS — I4510 Unspecified right bundle-branch block: Secondary | ICD-10-CM | POA: Insufficient documentation

## 2018-06-25 NOTE — Progress Notes (Signed)
   Subjective:    Patient ID: Paul [name], male    DOB: 01-16-1981, 38 y.o.   MRN: [id number]  HPI Chief Complaint  Patient presents with  . chest pain    chest pain, started yesterday and now having pain in the upper stomach area   Presents with one episode of sharp mid sternal chest pain while doing nothing. The pain was non radiating, lasted 5 minutes and resolved spontaneously. No diaphoresis, dizziness, cough, shortness of breath, N/V/D.  No chest pain with exertion. Denies being able to reproduce the pain with movement.  No syncope.   States this morning he has pain in his upper abdomen, dull and rated it 5/10. No N/V/D.   Reports having a normal cardiac cath in 2008 but I do not see these results. He did have a normal echo in 2008.. States he had a "mild heart attack" due to low potassium and dehydration.  States heart disease runs in his family. States his father had a heart attack at age 46 and his sister has a Psychologist, forensic.   History of anxiety and last panic attack was 2 months ago.   Social drinker. Denies smoking.   Denies fever, chills, dizziness, chest pain, palpitations, shortness of breath, urinary symptoms, LE edema.   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   Reviewed allergies, medications, past medical, surgical, family, and social history.     Review of Systems Pertinent positives and negatives in the history of present illness.     Objective:   Physical Exam BP 110/60   Pulse 68   Temp 98.4 F (36.9 C) (Oral)   Resp 16   Wt 151 lb 9.6 oz (68.8 kg)   SpO2 99%   BMI 23.05 kg/m    Alert and oriented and in no distress.  Pharyngeal area is normal. Neck is supple without adenopathy or thyromegaly. Cardiac exam shows a regular sinus rhythm without murmurs or gallops. Lungs are clear to auscultation. Chest wall tenderness with palpation to 2nd intercostal space at left sternal border. Extremities without edema, pulses intact.  Skin is warm and dry, no pallor. PERRLA, EOMs intact.        Assessment & Plan:  Anterior chest wall pain - Plan: EKG 12-Lead, CBC with Differential/Platelet, Comprehensive metabolic panel  Family history of heart disease - Plan: EKG 12-Lead  Incomplete RBBB  ECG with incomplete RBBB with a normal rate. No old ones for comparison.   Here for an evaluation of a 5 minute episode of anterior mid sternal chest pain which has not returned. Now c/o upper abdominal pain. He does have chest wall tenderness. No syncope.  Will treat him with 800 mg of ibuprofen 3 times per day with food and if this makes his upper abdominal pain worse he will start on an OTC PPI such as Nexium or Prilosec.  Strict precautions that if he develops severe pain that does not resolve or has any new or worsening symptoms then he will call 911 or go to the closest ED.  Follow up with Dr. Redmond School next week. May need a repeat echo since last one was in 2008. Patient is convinced he had a "heart attack in 2008".

## 2018-06-25 NOTE — Patient Instructions (Signed)
Try taking 800 mg of ibuprofen 3 times a day with food for the next few days to see if this helps your pain.  If you have worsening upper abdominal pain, you may want to try taking over-the-counter Prilosec or Nexium to see if this helps.  Follow-up with Dr. Redmond School next week.

## 2018-06-26 LAB — CBC WITH DIFFERENTIAL/PLATELET
Basophils Absolute: 0 10*3/uL (ref 0.0–0.2)
Basos: 1 %
EOS (ABSOLUTE): 0 10*3/uL (ref 0.0–0.4)
Eos: 1 %
Hematocrit: 42.3 % (ref 37.5–51.0)
Hemoglobin: 15.4 g/dL (ref 13.0–17.7)
Immature Grans (Abs): 0 10*3/uL (ref 0.0–0.1)
Immature Granulocytes: 0 %
Lymphocytes Absolute: 1.8 10*3/uL (ref 0.7–3.1)
Lymphs: 33 %
MCH: 31.1 pg (ref 26.6–33.0)
MCHC: 36.4 g/dL — ABNORMAL HIGH (ref 31.5–35.7)
MCV: 86 fL (ref 79–97)
Monocytes Absolute: 0.4 10*3/uL (ref 0.1–0.9)
Monocytes: 7 %
Neutrophils Absolute: 3.2 10*3/uL (ref 1.4–7.0)
Neutrophils: 58 %
Platelets: 206 10*3/uL (ref 150–450)
RBC: 4.95 x10E6/uL (ref 4.14–5.80)
RDW: 12.7 % (ref 11.6–15.4)
WBC: 5.4 10*3/uL (ref 3.4–10.8)

## 2018-06-26 LAB — COMPREHENSIVE METABOLIC PANEL
ALT: 12 IU/L (ref 0–44)
AST: 14 IU/L (ref 0–40)
Albumin/Globulin Ratio: 2.3 — ABNORMAL HIGH (ref 1.2–2.2)
Albumin: 4.5 g/dL (ref 4.0–5.0)
Alkaline Phosphatase: 65 IU/L (ref 39–117)
BUN/Creatinine Ratio: 20 (ref 9–20)
BUN: 13 mg/dL (ref 6–20)
Bilirubin Total: 0.9 mg/dL (ref 0.0–1.2)
CO2: 27 mmol/L (ref 20–29)
Calcium: 10.2 mg/dL (ref 8.7–10.2)
Chloride: 102 mmol/L (ref 96–106)
Creatinine, Ser: 0.66 mg/dL — ABNORMAL LOW (ref 0.76–1.27)
GFR calc Af Amer: 143 mL/min/{1.73_m2} (ref >59)
GFR calc non Af Amer: 124 mL/min/{1.73_m2} (ref >59)
Globulin, Total: 2 g/dL (ref 1.5–4.5)
Glucose: 89 mg/dL (ref 65–99)
Potassium: 4.3 mmol/L (ref 3.5–5.2)
Sodium: 144 mmol/L (ref 134–144)
Total Protein: 6.5 g/dL (ref 6.0–8.5)

## 2018-06-29 ENCOUNTER — Encounter: Payer: Self-pay | Admitting: Family Medicine

## 2018-06-29 ENCOUNTER — Other Ambulatory Visit: Payer: Self-pay

## 2018-06-29 ENCOUNTER — Ambulatory Visit (INDEPENDENT_AMBULATORY_CARE_PROVIDER_SITE_OTHER): Payer: 59 | Admitting: Family Medicine

## 2018-06-29 VITALS — BP 126/86 | HR 84 | Temp 96.9°F | Wt 150.4 lb

## 2018-06-29 DIAGNOSIS — F410 Panic disorder [episodic paroxysmal anxiety] without agoraphobia: Secondary | ICD-10-CM | POA: Insufficient documentation

## 2018-06-29 DIAGNOSIS — Z8249 Family history of ischemic heart disease and other diseases of the circulatory system: Secondary | ICD-10-CM | POA: Diagnosis not present

## 2018-06-29 DIAGNOSIS — Z113 Encounter for screening for infections with a predominantly sexual mode of transmission: Secondary | ICD-10-CM | POA: Diagnosis not present

## 2018-06-29 NOTE — Progress Notes (Signed)
   Subjective:    Patient ID: Paul [name], male    DOB: 04-Jan-1981, 38 y.o.   MRN: [id number]  HPI He is here for consult concerning episodes of chest pain.  He was recently seen and evaluated for chest pain with a negative evaluation.  There is a family history of heart disease.  The chest pain that he had was not necessarily associated with physical activity.  It was at least on one occasion while he was at work.  He does occasionally have difficulty with panic attacks that are caused by stress usually from work. He would also like to have STD evaluation.   Review of Systems     Objective:   Physical Exam Alert and in no distress otherwise not examined       Assessment & Plan:  Family history of heart disease - Plan: HIV Antibody (routine testing w rflx)  Screen for STD (sexually transmitted disease) - Plan: RPR, Lipid panel  Panic attacks I discussed the chest pain with him in regard to other potential symptoms of heart disease.  Recommended he keep track of the chest pain to see if it is associated with anything in particular including physical activity or stress related factors. I then discussed his anxiety/panic attacks.  He recognizes that he is a very sensitive person and does let other people attacked him more than he should.  I discussed this in detail with him explaining that nothing or no one can make him anxious unless he first gives him permission.  Discussed the use of medication however at this point he would like to hold off on that.  I explained that I could certainly give it to him in the future and this could be used as a safety net if needed. STD tests are ordered.  He will return here soon for complete exam. Over 25 minutes, the entire time spent in counseling and coordination of care.

## 2018-06-30 LAB — LIPID PANEL
Chol/HDL Ratio: 3.7 ratio (ref 0.0–5.0)
Cholesterol, Total: 162 mg/dL (ref 100–199)
HDL: 44 mg/dL (ref >39)
LDL Calculated: 101 mg/dL — ABNORMAL HIGH (ref 0–99)
Triglycerides: 83 mg/dL (ref 0–149)
VLDL Cholesterol Cal: 17 mg/dL (ref 5–40)

## 2018-06-30 LAB — HIV ANTIBODY (ROUTINE TESTING W REFLEX): HIV Screen 4th Generation wRfx: NONREACTIVE

## 2018-06-30 LAB — RPR: RPR Ser Ql: NONREACTIVE

## 2018-07-21 ENCOUNTER — Other Ambulatory Visit: Payer: Self-pay

## 2018-07-21 ENCOUNTER — Ambulatory Visit (INDEPENDENT_AMBULATORY_CARE_PROVIDER_SITE_OTHER): Payer: 59 | Admitting: Family Medicine

## 2018-07-21 ENCOUNTER — Encounter: Payer: Self-pay | Admitting: Family Medicine

## 2018-07-21 VITALS — BP 112/78 | HR 63 | Temp 98.6°F | Ht 68.0 in | Wt 155.6 lb

## 2018-07-21 DIAGNOSIS — H6123 Impacted cerumen, bilateral: Secondary | ICD-10-CM

## 2018-07-21 DIAGNOSIS — Z8659 Personal history of other mental and behavioral disorders: Secondary | ICD-10-CM | POA: Diagnosis not present

## 2018-07-21 DIAGNOSIS — Z8249 Family history of ischemic heart disease and other diseases of the circulatory system: Secondary | ICD-10-CM | POA: Diagnosis not present

## 2018-07-21 DIAGNOSIS — F988 Other specified behavioral and emotional disorders with onset usually occurring in childhood and adolescence: Secondary | ICD-10-CM

## 2018-07-21 DIAGNOSIS — Z0000 Encounter for general adult medical examination without abnormal findings: Secondary | ICD-10-CM

## 2018-07-21 NOTE — Progress Notes (Signed)
   Subjective:    Patient ID: Paul [name], male    DOB: 1980/09/26, 38 y.o.   MRN: [id number]  HPI He is here for complete examination.  He has no particular concerns or complaints.  Does have an underlying history of ADD but presently is not on medications.  He also has a previous history of panic attacks but is not having any difficulty with this.  He does have some stress at work but seems to be handling this fairly well.  He does not smoke and drinks socially.  He is not presently sexually active.  Family and social history as well as health maintenance and immunizations was reviewed.  His sister does have a pacemaker and his father had cardiac stent in his mid 64s.   Review of Systems  All other systems reviewed and are negative.      Objective:   Physical Exam BP 112/78 (BP Location: Left Arm, Patient Position: Sitting)   Pulse 63   Temp 98.6 F (37 C)   Ht 5\' 8"  (1.727 m)   Wt 155 lb 9.6 oz (70.6 kg)   SpO2 96%   BMI 23.66 kg/m   General Appearance:    Alert, cooperative, no distress, appears stated age  Head:    Normocephalic, without obvious abnormality, atraumatic  Eyes:    PERRL, conjunctiva/corneas clear, EOM's intact, fundi    benign  Ears:    Normal TM's and external ear canals normal after wax was removed bilaterally  Nose:   Nares normal, mucosa normal, no drainage or sinus   tenderness  Throat:   Lips, mucosa, and tongue normal; teeth and gums normal  Neck:   Supple, no lymphadenopathy;  thyroid:  no   enlargement/tenderness/nodules; no carotid   bruit or JVD  Back:    Spine nontender, no curvature, ROM normal, no CVA     tenderness  Lungs:     Clear to auscultation bilaterally without wheezes, rales or     ronchi; respirations unlabored  Chest Wall:    No tenderness or deformity   Heart:    Regular rate and rhythm, S1 and S2 normal, no murmur, rub   or gallop  Breast Exam:    No chest wall tenderness, masses or gynecomastia  Abdomen:     Soft, non-tender,  nondistended, normoactive bowel sounds,    no masses, no hepatosplenomegaly  Genitalia:    Normal male external genitalia without lesions.  Testicles without masses.  No inguinal hernias.  Rectal:   Deferred due to age <40 and lack of symptoms  Extremities:   No clubbing, cyanosis or edema  Pulses:   2+ and symmetric all extremities  Skin:   Skin color, texture, turgor normal, no rashes or lesions  Lymph nodes:   Cervical, supraclavicular, and axillary nodes normal  Neurologic:   CNII-XII intact, normal strength, sensation and gait; reflexes 2+ and symmetric throughout          Psych:   Normal mood, affect, hygiene and grooming.          Assessment & Plan:  Routine general medical examination at a health care facility  Family history of heart disease  History of panic attacks  Attention deficit disorder, unspecified hyperactivity presence  Bilateral impacted cerumen I encouraged him to continue to take good care of himself.  Discussed follow-up examination every several years.  He was comfortable with that.

## 2018-07-29 ENCOUNTER — Ambulatory Visit: Payer: Self-pay | Admitting: Family Medicine

## 2019-08-26 ENCOUNTER — Encounter (HOSPITAL_COMMUNITY): Payer: Self-pay | Admitting: *Deleted

## 2019-08-26 ENCOUNTER — Emergency Department (HOSPITAL_COMMUNITY)
Admission: EM | Admit: 2019-08-26 | Discharge: 2019-08-27 | Disposition: A | Discharge status: Home / Self Care | Payer: 59 | Attending: Emergency Medicine | Admitting: Emergency Medicine

## 2019-08-26 ENCOUNTER — Other Ambulatory Visit: Payer: Self-pay

## 2019-08-26 DIAGNOSIS — R531 Weakness: Secondary | ICD-10-CM | POA: Insufficient documentation

## 2019-08-26 DIAGNOSIS — R5383 Other fatigue: Secondary | ICD-10-CM

## 2019-08-26 HISTORY — DX: Anxiety disorder, unspecified: F41.9

## 2019-08-26 MED ORDER — SODIUM CHLORIDE 0.9 % IV BOLUS
1000.0000 mL | Freq: Once | INTRAVENOUS | Status: AC
  Administered 2019-08-26: 1000 mL via INTRAVENOUS

## 2019-08-26 NOTE — ED Provider Notes (Signed)
[facility] DEPT Provider Note   CSN: [id number] Arrival date & time: 08/26/19  2231     History Chief Complaint  Patient presents with  . Allergic Reaction    Paul [name] is a 39 y.o. male.  Patient is a 39 year old male with past medical history of ADHD, anxiety, depression.  He presents today for evaluation of weakness.  Patient states he has felt weak for the past 1 to 2 weeks, he believes related to working in a [employer].  Patient states he drinks plenty of fluids, however it is very hot in his work environment.  This evening, he was having a drink at a [employer] when he began to feel nauseated and dizzy.  He was then brought here for further evaluation.  Patient does report tightness in his chest which has been present intermittently this week.  He also describes a sensation of numbness in his left leg, but denies weakness.  The history is provided by the patient.       Past Medical History:  Diagnosis Date  . ADD (attention deficit disorder)   . Anxiety   . Depression     Patient Active Problem List   Diagnosis Date Noted  . History of panic attacks 07/21/2018  . 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 Family history of heart disease 06/25/2018  . ADD (attention deficit disorder) 04/18/2014    Past Surgical History:  Procedure Laterality Date  . CARDIAC CATHETERIZATION         Family History  Problem Relation Age of Onset  . Heart disease Sister 72       pacemaker  . Heart attack Father 19  . Heart disease Father   . Depression Brother   . Anxiety disorder Brother   . ADD / ADHD Brother   . Alcohol abuse Paternal Aunt     Social History   Tobacco Use  . Smoking status: Never Smoker  . Smokeless tobacco: Never Used  Vaping Use  . Vaping Use: Never used  Substance Use Topics  . Alcohol use: Yes    Alcohol/week: 1.0 standard drink    Types: 1 Glasses of wine per week  . Drug use: No    Home  Medications Prior to Admission medications   Medication Sig Start Date End Date Taking? Authorizing Provider  Multiple Vitamin (MULTIVITAMIN) tablet Take 1 tablet by mouth daily.    [provider]    Allergies    Sulfa antibiotics  Review of Systems   Review of Systems  All other systems reviewed and are negative.   Physical Exam Updated Vital Signs BP (!) 145/96 (BP Location: Right Arm)   Pulse 78   Temp 97.8 F (36.6 C) (Oral)   Resp 17   Ht 5\' 8"  (1.727 m)   Wt 72.6 kg   SpO2 98%   BMI 24.33 kg/m   Physical Exam Vitals and nursing note reviewed.  Constitutional:      General: He is not in acute distress.    Appearance: He is well-developed. He is not diaphoretic.  HENT:     Head: Normocephalic and atraumatic.     Mouth/Throat:     Mouth: Mucous membranes are moist.  Eyes:     Extraocular Movements: Extraocular movements intact.     Pupils: Pupils are equal, round, and reactive to light.  Cardiovascular:     Rate and Rhythm: Normal rate and regular rhythm.     Heart sounds: No murmur heard.  No friction rub.  Pulmonary:     Effort: Pulmonary effort is normal. No respiratory distress.     Breath sounds: Normal breath sounds. No wheezing or rales.  Abdominal:     General: Bowel sounds are normal. There is no distension.     Palpations: Abdomen is soft.     Tenderness: There is no abdominal tenderness.  Musculoskeletal:        General: No swelling or tenderness. Normal range of motion.     Cervical back: Normal range of motion and neck supple.     Right lower leg: No edema.     Left lower leg: No edema.  Skin:    General: Skin is warm and dry.  Neurological:     General: No focal deficit present.     Mental Status: He is alert and oriented to person, place, and time.     Cranial Nerves: No cranial nerve deficit.     Sensory: No sensory deficit.     Motor: No weakness.     Coordination: Coordination normal.     ED Results / Procedures /  Treatments   Labs (all labs ordered are listed, but only abnormal results are displayed) Labs Reviewed  BASIC METABOLIC PANEL  CBC WITH DIFFERENTIAL/PLATELET  TROPONIN I (HIGH SENSITIVITY)    EKG EKG Interpretation  Date/Time:  Friday August 26 2019 23:34:54 EDT Ventricular Rate:  77 PR Interval:    QRS Duration: 99 QT Interval:  387 QTC Calculation: 438 R Axis:   91 Text Interpretation: Sinus rhythm Borderline right axis deviation Confirmed by Veryl Speak [phone]) on 08/27/2019 1:29:00 AM   Radiology No results found.  Procedures Procedures (including critical care time)  Medications Ordered in ED Medications  sodium chloride 0.9 % bolus 1,000 mL (has no administration in time range)    ED Course  I have reviewed the triage vital signs and the nursing notes.  Pertinent labs & imaging results that were available during my care of the patient were reviewed by me and considered in my medical decision making (see chart for details).    MDM Rules/Calculators/A&P  Patient presenting here with complaints of weakness.  This became worse this evening while drinking a blackberry mojito at a [employer].  Patient arrives here with stable vital signs and laboratory studies which are essentially unremarkable with the exception of a mildly decreased potassium of 3.3.  I doubt this to be the cause of his symptoms.  Cardiac work-up also unremarkable including EKG and troponin.  Patient does report working in a [employer] throughout the week.  It is possible that this is a heat related illness.  Patient was hydrated and seems to be feeling better.    At this point, I feel as though discharge is appropriate.  He is to follow-up with primary doctor if not improving.  Final Clinical Impression(s) / ED Diagnoses Final diagnoses:  None    Rx / DC Orders ED Discharge Orders    None       Veryl Speak, MD 08/27/19 0130

## 2019-08-26 NOTE — ED Triage Notes (Signed)
Pt was at [facility] when he felt weak, focused on no energy and light headed, pt was given 2 epi pens, not sure as to why, no airway problems

## 2019-08-27 LAB — CBC WITH DIFFERENTIAL/PLATELET
Abs Immature Granulocytes: 0.02 10*3/uL (ref 0.00–0.07)
Basophils Absolute: 0.1 10*3/uL (ref 0.0–0.1)
Basophils Relative: 1 %
Eosinophils Absolute: 0 10*3/uL (ref 0.0–0.5)
Eosinophils Relative: 0 %
HCT: 40.1 % (ref 39.0–52.0)
Hemoglobin: 14 g/dL (ref 13.0–17.0)
Immature Granulocytes: 0 %
Lymphocytes Relative: 28 %
Lymphs Abs: 2 10*3/uL (ref 0.7–4.0)
MCH: 30.4 pg (ref 26.0–34.0)
MCHC: 34.9 g/dL (ref 30.0–36.0)
MCV: 87 fL (ref 80.0–100.0)
Monocytes Absolute: 0.5 10*3/uL (ref 0.1–1.0)
Monocytes Relative: 7 %
Neutro Abs: 4.6 10*3/uL (ref 1.7–7.7)
Neutrophils Relative %: 64 %
Platelets: 201 10*3/uL (ref 150–400)
RBC: 4.61 MIL/uL (ref 4.22–5.81)
RDW: 12.6 % (ref 11.5–15.5)
WBC: 7.3 10*3/uL (ref 4.0–10.5)
nRBC: 0 % (ref 0.0–0.2)

## 2019-08-27 LAB — BASIC METABOLIC PANEL
Anion gap: 14 (ref 5–15)
BUN: 11 mg/dL (ref 6–20)
CO2: 25 mmol/L (ref 22–32)
Calcium: 9.2 mg/dL (ref 8.9–10.3)
Chloride: 103 mmol/L (ref 98–111)
Creatinine, Ser: 0.64 mg/dL (ref 0.61–1.24)
GFR calc Af Amer: >60 mL/min (ref >60)
GFR calc non Af Amer: >60 mL/min (ref >60)
Glucose, Bld: 129 mg/dL — ABNORMAL HIGH (ref 70–99)
Potassium: 3.3 mmol/L — ABNORMAL LOW (ref 3.5–5.1)
Sodium: 142 mmol/L (ref 135–145)

## 2019-08-27 LAB — TROPONIN I (HIGH SENSITIVITY): Troponin I (High Sensitivity): 6 ng/L (ref <18)

## 2019-08-27 NOTE — Discharge Instructions (Signed)
Drink plenty of fluids and get plenty of rest.  Follow-up with your primary doctor if symptoms or not improving in the next few days, and return to the ER if symptoms significantly worsen or change.

## 2019-12-19 ENCOUNTER — Other Ambulatory Visit: Payer: Self-pay | Admitting: Physician Assistant

## 2019-12-19 ENCOUNTER — Ambulatory Visit
Admission: RE | Admit: 2019-12-19 | Discharge: 2019-12-19 | Disposition: A | Discharge status: Home / Self Care | Payer: Worker's Compensation | Source: Ambulatory Visit | Attending: Physician Assistant | Admitting: Physician Assistant

## 2019-12-19 ENCOUNTER — Ambulatory Visit
Admission: RE | Admit: 2019-12-19 | Discharge: 2019-12-19 | Disposition: A | Discharge status: Home / Self Care | Payer: Worker's Compensation | Attending: Physician Assistant | Admitting: Physician Assistant

## 2019-12-19 DIAGNOSIS — S99921A Unspecified injury of right foot, initial encounter: Secondary | ICD-10-CM

## 2020-01-27 ENCOUNTER — Telehealth: Payer: Self-pay | Admitting: Family Medicine

## 2020-01-27 NOTE — Telephone Encounter (Signed)
No one in particular.  Any of the urologists can handle this.

## 2020-01-27 NOTE — Telephone Encounter (Signed)
Pts dad called and said pt wants to get a Vasectomy and wanted to know if you had any recommendations for Drs

## 2020-04-19 ENCOUNTER — Telehealth: Payer: Self-pay | Admitting: Family Medicine

## 2020-04-19 NOTE — Telephone Encounter (Signed)
Dismissal letter in guarantor snapshot  °

## 2020-05-15 ENCOUNTER — Other Ambulatory Visit: Payer: Self-pay | Admitting: Urology

## 2020-06-05 ENCOUNTER — Encounter (HOSPITAL_BASED_OUTPATIENT_CLINIC_OR_DEPARTMENT_OTHER): Admission: RE | Payer: Self-pay | Source: Home / Self Care

## 2020-06-05 ENCOUNTER — Ambulatory Visit (HOSPITAL_BASED_OUTPATIENT_CLINIC_OR_DEPARTMENT_OTHER): Admission: RE | Admit: 2020-06-05 | Payer: Managed Care, Other (non HMO) | Source: Home / Self Care | Admitting: Urology

## 2020-06-05 SURGERY — VASECTOMY
Anesthesia: General | Laterality: Bilateral

## 2021-11-22 ENCOUNTER — Ambulatory Visit: Payer: Self-pay | Admitting: Internal Medicine

## 2022-01-07 IMAGING — CR DG FOOT COMPLETE 3+V*R*
3 series · 3 of 3 positions shown · non-contrast
Comparison: None.

CLINICAL DATA: Injury foot, right-side. Rammed foot into metal
plate while wearing steel boot set work yesterday.

EXAM:
RIGHT FOOT COMPLETE - 3+ VIEW

[foot ap]
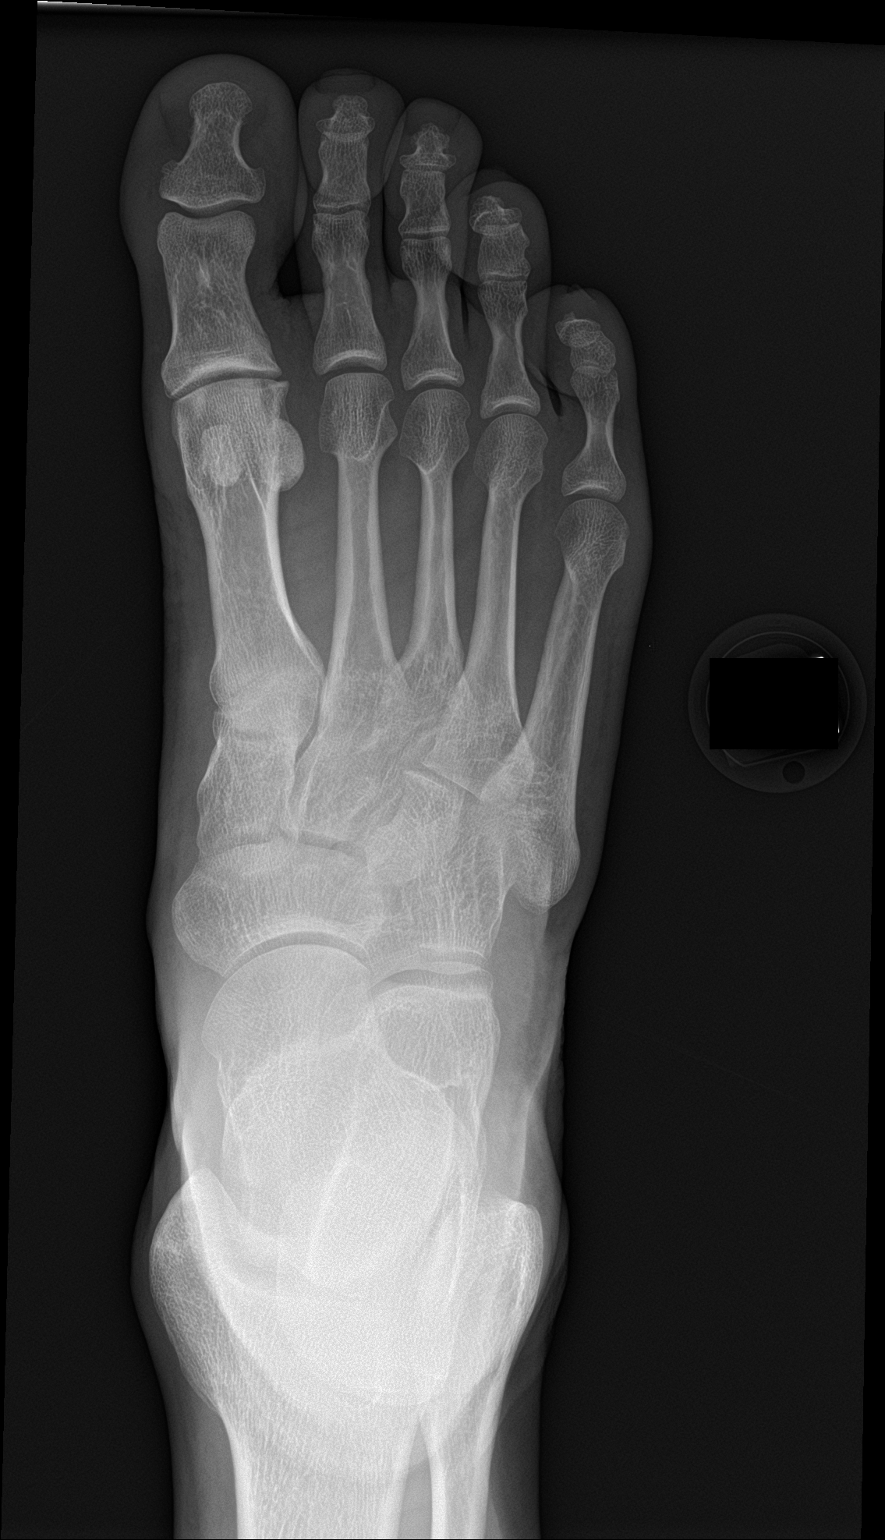

[foot obl]
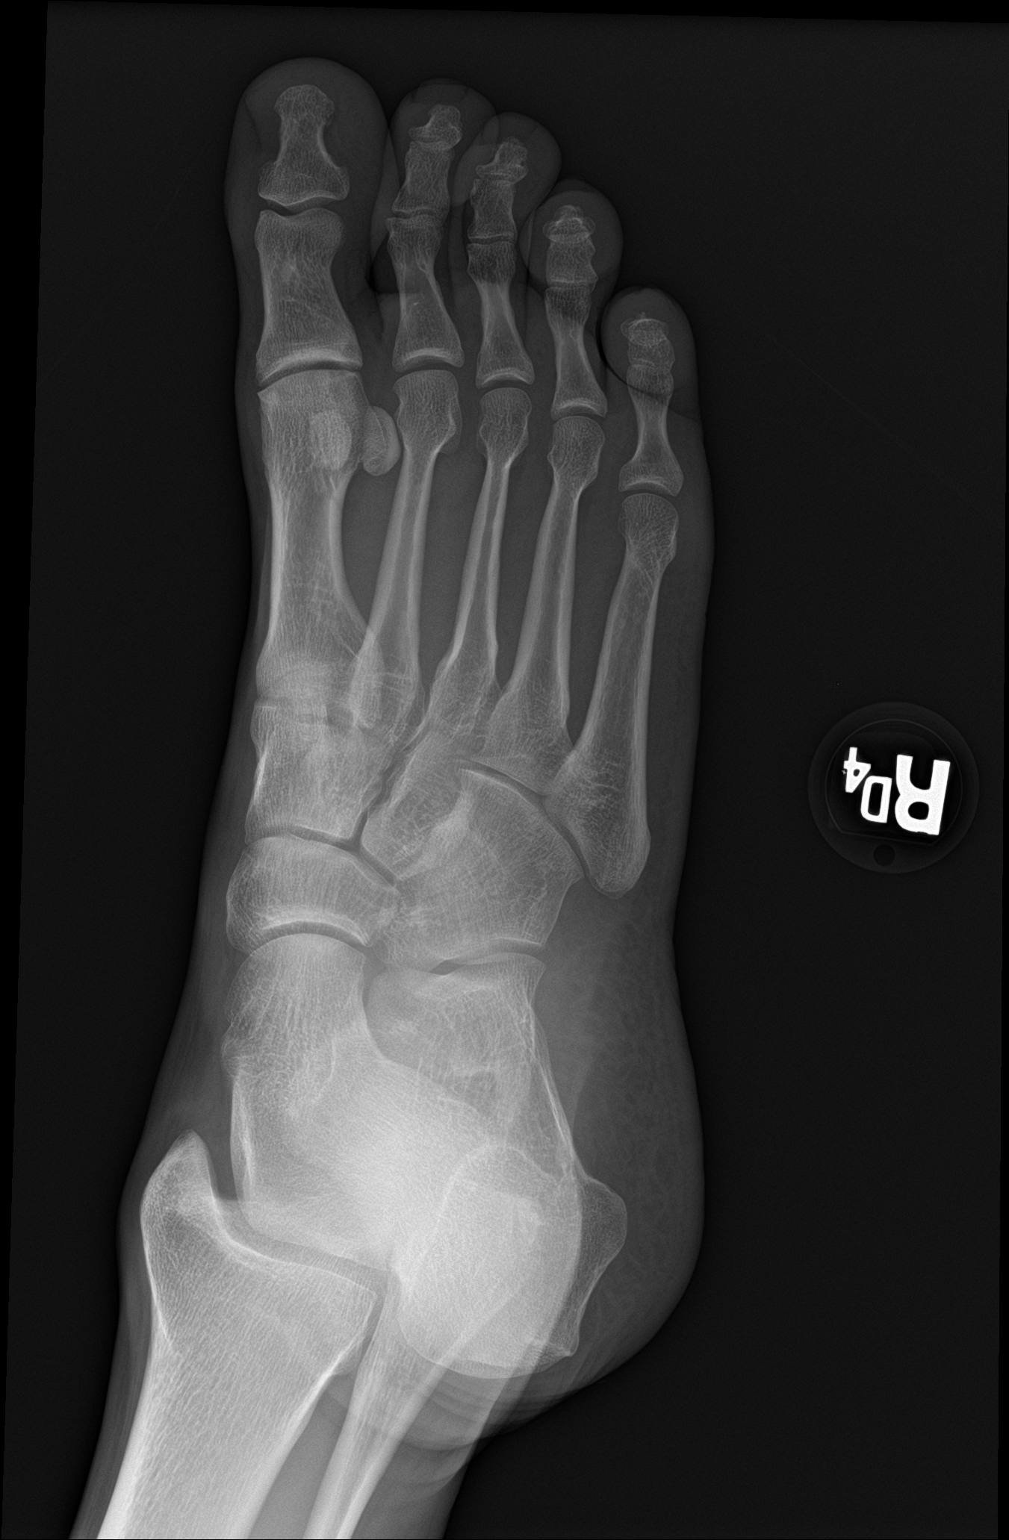

[foot lat]
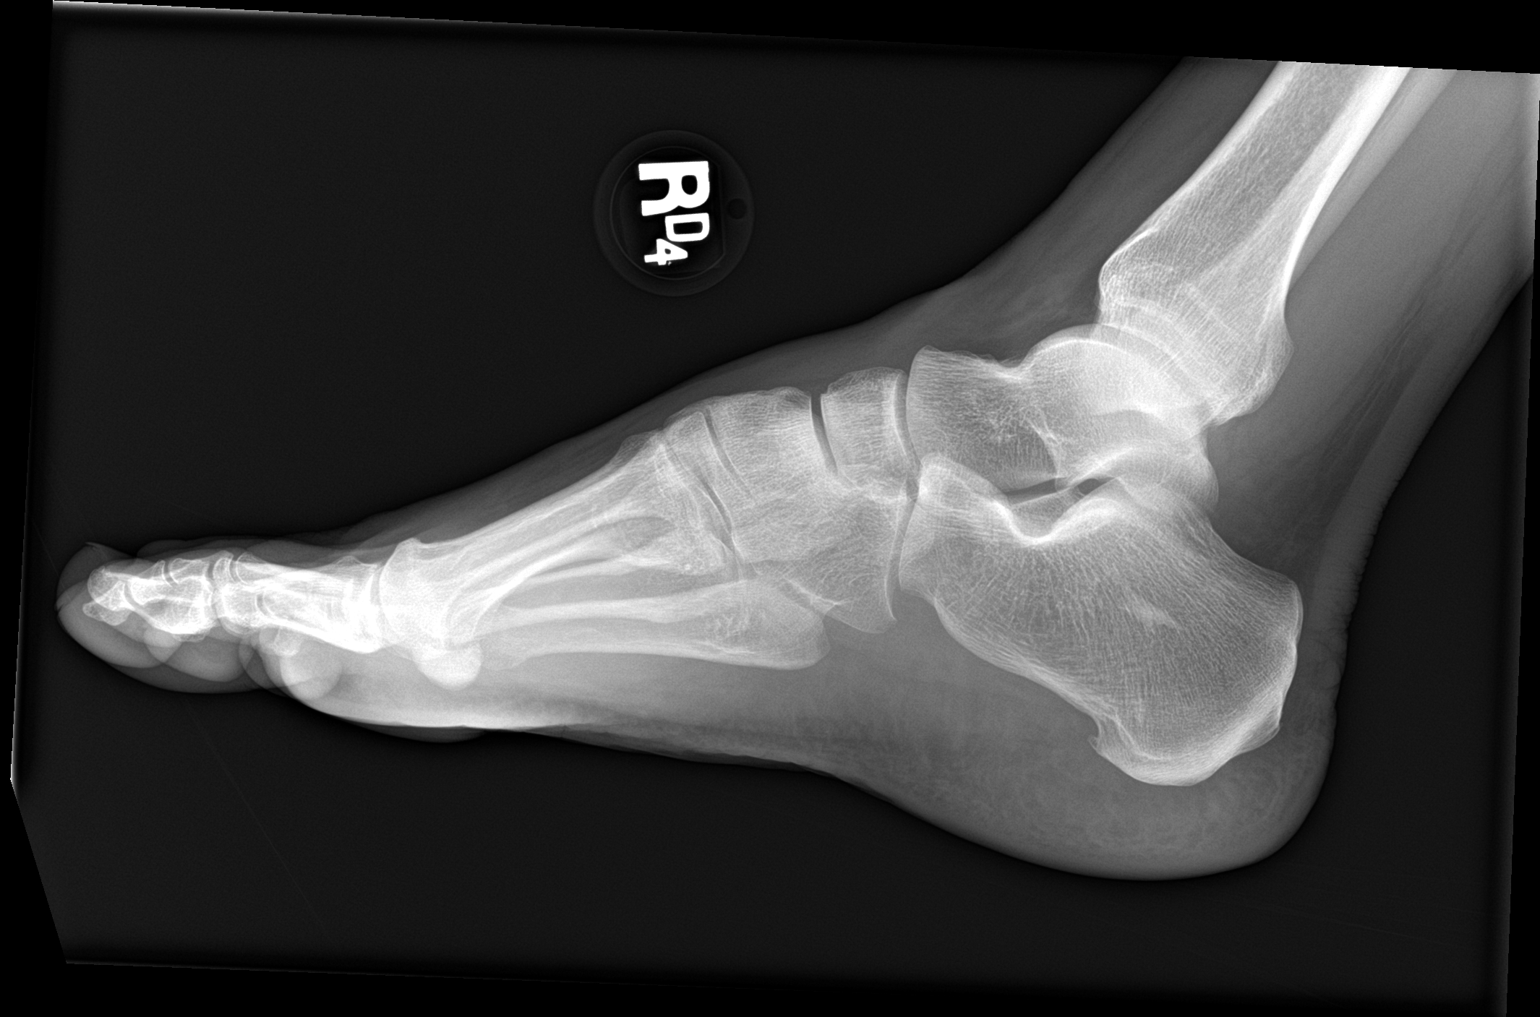

[3 of 3 positions shown; findings below may reference images not displayed]

FINDINGS: There is no evidence of fracture or dislocation. Cortical
irregularity of the right metatarsal body likely represents an old
healed fracture. There is no evidence of severe arthropathy or other
focal bone abnormality. Soft tissues are unremarkable.
IMPRESSION: No acute displaced fracture or dislocation.

## 2022-06-05 DIAGNOSIS — M545 Low back pain, unspecified: Secondary | ICD-10-CM | POA: Insufficient documentation

## 2022-06-05 DIAGNOSIS — M419 Scoliosis, unspecified: Secondary | ICD-10-CM | POA: Insufficient documentation

## 2022-09-20 DIAGNOSIS — M9901 Segmental and somatic dysfunction of cervical region: Secondary | ICD-10-CM | POA: Diagnosis not present

## 2022-09-20 DIAGNOSIS — M9907 Segmental and somatic dysfunction of upper extremity: Secondary | ICD-10-CM | POA: Diagnosis not present

## 2022-09-20 DIAGNOSIS — M9902 Segmental and somatic dysfunction of thoracic region: Secondary | ICD-10-CM | POA: Diagnosis not present

## 2022-09-20 DIAGNOSIS — M5383 Other specified dorsopathies, cervicothoracic region: Secondary | ICD-10-CM | POA: Diagnosis not present

## 2022-10-06 DIAGNOSIS — E782 Mixed hyperlipidemia: Secondary | ICD-10-CM | POA: Diagnosis not present

## 2022-10-06 DIAGNOSIS — E559 Vitamin D deficiency, unspecified: Secondary | ICD-10-CM | POA: Diagnosis not present

## 2022-10-06 DIAGNOSIS — Z113 Encounter for screening for infections with a predominantly sexual mode of transmission: Secondary | ICD-10-CM | POA: Diagnosis not present

## 2022-10-06 DIAGNOSIS — F411 Generalized anxiety disorder: Secondary | ICD-10-CM | POA: Diagnosis not present

## 2022-10-22 DIAGNOSIS — F411 Generalized anxiety disorder: Secondary | ICD-10-CM | POA: Diagnosis not present

## 2022-10-30 DIAGNOSIS — F331 Major depressive disorder, recurrent, moderate: Secondary | ICD-10-CM | POA: Diagnosis not present

## 2022-10-30 DIAGNOSIS — F411 Generalized anxiety disorder: Secondary | ICD-10-CM | POA: Diagnosis not present

## 2022-12-01 DIAGNOSIS — H6121 Impacted cerumen, right ear: Secondary | ICD-10-CM | POA: Diagnosis not present

## 2022-12-01 DIAGNOSIS — F411 Generalized anxiety disorder: Secondary | ICD-10-CM | POA: Diagnosis not present

## 2023-01-14 DIAGNOSIS — F411 Generalized anxiety disorder: Secondary | ICD-10-CM | POA: Diagnosis not present

## 2023-01-14 DIAGNOSIS — H6123 Impacted cerumen, bilateral: Secondary | ICD-10-CM | POA: Diagnosis not present

## 2023-01-29 DIAGNOSIS — L03031 Cellulitis of right toe: Secondary | ICD-10-CM | POA: Diagnosis not present

## 2023-03-03 ENCOUNTER — Ambulatory Visit: Payer: BC Managed Care – PPO | Admitting: Neurology

## 2023-03-03 ENCOUNTER — Encounter: Payer: Self-pay | Admitting: Neurology

## 2023-03-03 VITALS — BP 118/72 | HR 64 | Ht 68.0 in | Wt 172.0 lb

## 2023-03-03 DIAGNOSIS — G250 Essential tremor: Secondary | ICD-10-CM | POA: Insufficient documentation

## 2023-03-03 NOTE — Progress Notes (Signed)
 Chief Complaint  Patient presents with   New Patient (Initial Visit)    Pt in 15, here with wife Roselie  Pt is referred by Cyndee Bullock PA for tremor. Pt states the tremors on his hands started when he was 43 yrs old, here int he past 6 months they have gotten much worse.       ASSESSMENT AND PLAN  Paul [name] is a 43 y.o. male   Essential tremors Anxiety  Laboratory evaluation including thyroid function to rule out treatable etiology, family history of tremor  Offered treatment option including beta-blocker, primidone, he decided not to proceed  Emphasized importance of optimize his anxiety control,  Will follow-up with his primary care only  DIAGNOSTIC DATA (LABS, IMAGING, TESTING) - I reviewed patient records, labs, notes, testing and imaging myself where available.   MEDICAL HISTORY:  Paul [name], is a 43 year old male, accompanied by his wife seen in request by primary care PA Bullock Cyndee for evaluation of tremor, initial evaluation was on March 03, 2023   History is obtained from the patient and review of electronic medical records. I personally reviewed pertinent available imaging films in PACS.   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  PHYSICAL EXAMNIATION:  Gen: NAD, conversant, well  nourised, well groomed                     Cardiovascular: Regular rate rhythm, no peripheral edema, warm, nontender. Eyes: Conjunctivae clear without exudates or hemorrhage Neck: Supple, no carotid bruits. Pulmonary: Clear to auscultation bilaterally   NEUROLOGICAL EXAM:  MENTAL STATUS: Speech/cognition: Awake, alert, oriented to history taking and casual conversation CRANIAL NERVES: CN II: Visual fields are full to confrontation. Pupils are round equal and briskly reactive to light. CN III, IV, VI: extraocular movement are normal. No ptosis. CN V: Facial sensation is intact to light touch CN VII: Face is symmetric with normal eye closure  CN VIII: Hearing is normal to causal conversation. CN IX, X: Phonation is normal. CN XI: Head turning and shoulder shrug are intact  MOTOR: There is no pronator drift of out-stretched arms.  Mild difficulty drawing spiral circle muscle bulk and tone are normal. Muscle strength is normal.  REFLEXES: Reflexes are 2+ and symmetric at the biceps, triceps, knees, and ankles. Plantar responses are flexor.  SENSORY: Intact to light touch, pinprick and vibratory sensation are intact in fingers and toes.  COORDINATION: There is no trunk or limb dysmetria noted.  GAIT/STANCE: Posture is normal. Gait is steady with normal steps, base, arm swing, and turning. Heel and toe walking are normal. Tandem gait is normal with deliberate effort Romberg is absent.  REVIEW OF SYSTEMS:  Full 14 system review of systems performed and notable only for as above All other review  of systems were negative.   ALLERGIES: Allergies  Allergen Reactions   Sulfa Antibiotics     HOME MEDICATIONS: Current Outpatient Medications  Medication Sig Dispense Refill   diazepam  (VALIUM ) 5 MG tablet 1 tablet as needed Orally twice a day     Omega-3 Fatty Acids (FISH OIL) 500 MG CAPS Take by mouth.     No current facility-administered medications for this visit.    PAST  MEDICAL HISTORY: Past Medical History:  Diagnosis Date   ADD (attention deficit disorder)    Anxiety    Depression     PAST SURGICAL HISTORY: Past Surgical History:  Procedure Laterality Date   CARDIAC CATHETERIZATION      FAMILY HISTORY: Family History  Problem Relation Age of Onset   Heart disease Sister 13       pacemaker   Heart attack Father 37   Heart disease Father    Depression Brother    Anxiety disorder Brother    ADD / ADHD Brother    Alcohol abuse Paternal Aunt     SOCIAL HISTORY: Social History   Socioeconomic History   Marital status: Married    Spouse name: Not on file   Number of children: Not on file   Years of education: Not on file   Highest education level: Not on file  Occupational History   Occupation: [employer], [employer]  Tobacco Use   Smoking status: Never   Smokeless tobacco: Never  Vaping Use   Vaping status: Never Used  Substance and Sexual Activity   Alcohol use: Yes    Alcohol/week: 1.0 standard drink of alcohol    Types: 1 Glasses of wine per week    Comment: social   Drug use: No   Sexual activity: Not Currently  Other Topics Concern   Not on file  Social History Narrative   Not on file   Social Drivers of Health   Financial Resource Strain: Not on file  Food Insecurity: Not on file  Transportation Needs: Not on file  Physical Activity: Not on file  Stress: Not on file  Social Connections: Unknown (05/02/2022)   [facility] from [facility]   Social Network    Social Network: Not on file  Intimate Partner Violence: Unknown (05/02/2022)   Received from [facility]   HITS    Physically Hurt: Not on file    Insult or Talk Down To: Not on file    Threaten Physical Harm: Not on file    Scream or Curse: Not on file      Modena Callander, M.D. Ph.D.  [facility] [address] [place], [place] [address] Ph: [phone] Fax: [phone]  CC:  Nena Cyndee DELENA DEVONNA [address] [place],  [place] [address]  Nena Cyndee DELENA, PA-C

## 2023-03-04 LAB — CBC WITH DIFFERENTIAL/PLATELET
Basophils Absolute: 0 10*3/uL (ref 0.0–0.2)
Basos: 1 %
EOS (ABSOLUTE): 0.1 10*3/uL (ref 0.0–0.4)
Eos: 1 %
Hematocrit: 47.6 % (ref 37.5–51.0)
Hemoglobin: 16.4 g/dL (ref 13.0–17.7)
Immature Grans (Abs): 0 10*3/uL (ref 0.0–0.1)
Immature Granulocytes: 0 %
Lymphocytes Absolute: 1.9 10*3/uL (ref 0.7–3.1)
Lymphs: 38 %
MCH: 30.1 pg (ref 26.6–33.0)
MCHC: 34.5 g/dL (ref 31.5–35.7)
MCV: 87 fL (ref 79–97)
Monocytes Absolute: 0.3 10*3/uL (ref 0.1–0.9)
Monocytes: 7 %
Neutrophils Absolute: 2.7 10*3/uL (ref 1.4–7.0)
Neutrophils: 53 %
Platelets: 200 10*3/uL (ref 150–450)
RBC: 5.45 x10E6/uL (ref 4.14–5.80)
RDW: 12.9 % (ref 11.6–15.4)
WBC: 5.1 10*3/uL (ref 3.4–10.8)

## 2023-03-04 LAB — COMPREHENSIVE METABOLIC PANEL
ALT: 13 [IU]/L (ref 0–44)
AST: 15 [IU]/L (ref 0–40)
Albumin: 4.4 g/dL (ref 4.1–5.1)
Alkaline Phosphatase: 66 [IU]/L (ref 44–121)
BUN/Creatinine Ratio: 10 (ref 9–20)
BUN: 7 mg/dL (ref 6–24)
Bilirubin Total: 0.8 mg/dL (ref 0.0–1.2)
CO2: 27 mmol/L (ref 20–29)
Calcium: 9.9 mg/dL (ref 8.7–10.2)
Chloride: 100 mmol/L (ref 96–106)
Creatinine, Ser: 0.72 mg/dL — ABNORMAL LOW (ref 0.76–1.27)
Globulin, Total: 2.2 g/dL (ref 1.5–4.5)
Glucose: 92 mg/dL (ref 70–99)
Potassium: 4.7 mmol/L (ref 3.5–5.2)
Sodium: 142 mmol/L (ref 134–144)
Total Protein: 6.6 g/dL (ref 6.0–8.5)
eGFR: 118 mL/min/{1.73_m2} (ref >59)

## 2023-03-04 LAB — TSH: TSH: 1.33 u[IU]/mL (ref 0.450–4.500)

## 2023-03-10 ENCOUNTER — Encounter: Payer: Self-pay | Admitting: Neurology

## 2023-05-04 ENCOUNTER — Encounter (HOSPITAL_COMMUNITY): Payer: Self-pay

## 2023-05-04 ENCOUNTER — Emergency Department (HOSPITAL_COMMUNITY)

## 2023-05-04 ENCOUNTER — Emergency Department (HOSPITAL_COMMUNITY)
Admission: EM | Admit: 2023-05-04 | Discharge: 2023-05-04 | Disposition: A | Discharge status: Home / Self Care | Attending: Emergency Medicine | Admitting: Emergency Medicine

## 2023-05-04 ENCOUNTER — Other Ambulatory Visit: Payer: Self-pay

## 2023-05-04 DIAGNOSIS — M545 Low back pain, unspecified: Secondary | ICD-10-CM | POA: Diagnosis not present

## 2023-05-04 DIAGNOSIS — M47816 Spondylosis without myelopathy or radiculopathy, lumbar region: Secondary | ICD-10-CM | POA: Diagnosis not present

## 2023-05-04 DIAGNOSIS — M438X5 Other specified deforming dorsopathies, thoracolumbar region: Secondary | ICD-10-CM | POA: Diagnosis not present

## 2023-05-04 DIAGNOSIS — N201 Calculus of ureter: Secondary | ICD-10-CM

## 2023-05-04 DIAGNOSIS — R109 Unspecified abdominal pain: Secondary | ICD-10-CM | POA: Diagnosis not present

## 2023-05-04 DIAGNOSIS — K8020 Calculus of gallbladder without cholecystitis without obstruction: Secondary | ICD-10-CM | POA: Diagnosis not present

## 2023-05-04 DIAGNOSIS — N132 Hydronephrosis with renal and ureteral calculous obstruction: Secondary | ICD-10-CM | POA: Diagnosis not present

## 2023-05-04 HISTORY — DX: Scoliosis, unspecified: M41.9

## 2023-05-04 LAB — URINALYSIS, ROUTINE W REFLEX MICROSCOPIC
Glucose, UA: NEGATIVE mg/dL
Ketones, ur: NEGATIVE mg/dL
Leukocytes,Ua: NEGATIVE
Nitrite: NEGATIVE
Protein, ur: 100 mg/dL — AB
Specific Gravity, Urine: 1.038 — ABNORMAL HIGH (ref 1.005–1.030)
pH: 6 (ref 5.0–8.0)

## 2023-05-04 LAB — COMPREHENSIVE METABOLIC PANEL
ALT: 17 U/L (ref 0–44)
AST: 21 U/L (ref 15–41)
Albumin: 3.8 g/dL (ref 3.5–5.0)
Alkaline Phosphatase: 46 U/L (ref 38–126)
Anion gap: 10 (ref 5–15)
BUN: 13 mg/dL (ref 6–20)
CO2: 27 mmol/L (ref 22–32)
Calcium: 8.9 mg/dL (ref 8.9–10.3)
Chloride: 95 mmol/L — ABNORMAL LOW (ref 98–111)
Creatinine, Ser: 1.1 mg/dL (ref 0.61–1.24)
GFR, Estimated: >60 mL/min (ref >60)
Glucose, Bld: 135 mg/dL — ABNORMAL HIGH (ref 70–99)
Potassium: 3.9 mmol/L (ref 3.5–5.1)
Sodium: 132 mmol/L — ABNORMAL LOW (ref 135–145)
Total Bilirubin: 2 mg/dL — ABNORMAL HIGH (ref 0.0–1.2)
Total Protein: 6.4 g/dL — ABNORMAL LOW (ref 6.5–8.1)

## 2023-05-04 LAB — CBC
HCT: 43.4 % (ref 39.0–52.0)
Hemoglobin: 15.5 g/dL (ref 13.0–17.0)
MCH: 30.4 pg (ref 26.0–34.0)
MCHC: 35.7 g/dL (ref 30.0–36.0)
MCV: 85.1 fL (ref 80.0–100.0)
Platelets: 161 10*3/uL (ref 150–400)
RBC: 5.1 MIL/uL (ref 4.22–5.81)
RDW: 12.2 % (ref 11.5–15.5)
WBC: 8 10*3/uL (ref 4.0–10.5)
nRBC: 0 % (ref 0.0–0.2)

## 2023-05-04 MED ORDER — OXYCODONE HCL 5 MG PO TABS: 5.0000 mg | ORAL_TABLET | Freq: Four times a day (QID) | ORAL | 0 refills | Status: AC | PRN

## 2023-05-04 MED ORDER — ONDANSETRON HCL 4 MG/2ML IJ SOLN
4.0000 mg | Freq: Once | INTRAMUSCULAR | Status: AC
  Administered 2023-05-04: 4 mg via INTRAVENOUS
  Filled 2023-05-04: qty 2

## 2023-05-04 MED ORDER — TAMSULOSIN HCL 0.4 MG PO CAPS: 0.4000 mg | ORAL_CAPSULE | Freq: Every day | ORAL | 0 refills | Status: AC

## 2023-05-04 MED ORDER — FENTANYL CITRATE PF 50 MCG/ML IJ SOSY
25.0000 ug | PREFILLED_SYRINGE | Freq: Once | INTRAMUSCULAR | Status: AC
  Administered 2023-05-04: 25 ug via INTRAVENOUS
  Filled 2023-05-04: qty 1

## 2023-05-04 MED ORDER — KETOROLAC TROMETHAMINE 15 MG/ML IJ SOLN
15.0000 mg | Freq: Once | INTRAMUSCULAR | Status: AC
  Administered 2023-05-04: 15 mg via INTRAVENOUS
  Filled 2023-05-04: qty 1

## 2023-05-04 MED ORDER — KETOROLAC TROMETHAMINE 10 MG PO TABS: 10.0000 mg | ORAL_TABLET | Freq: Four times a day (QID) | ORAL | 0 refills | Status: AC | PRN

## 2023-05-04 MED ORDER — OXYCODONE-ACETAMINOPHEN 5-325 MG PO TABS
1.0000 | ORAL_TABLET | Freq: Once | ORAL | Status: AC
  Administered 2023-05-04: 1 via ORAL
  Filled 2023-05-04: qty 1

## 2023-05-04 MED ORDER — ONDANSETRON 4 MG PO TBDP: 4.0000 mg | ORAL_TABLET | Freq: Three times a day (TID) | ORAL | 0 refills | Status: AC | PRN

## 2023-05-04 NOTE — ED Provider Triage Note (Signed)
 Emergency Medicine Provider Triage Evaluation Note  Paul [name] , a 43 y.o. male  was evaluated in triage.  Pt complains of left lumbar back pain,hurts worse with walking. Hx of lumbar injury prior. No hx of kidney stones, no dysuria, no hematuria. Rates pain 20/10. Advil 4 hours PTA without improvement. .  Review of Systems  Positive: Back pain Negative: Abdominal pain, nausea, vomiting  Physical Exam  BP (!) 134/105 (BP Location: Right Arm)   Pulse 82   Temp 98.6 F (37 C)   Resp 18   Ht 5\' 8"  (1.727 m)   Wt 76.2 kg   SpO2 98%   BMI 25.54 kg/m  Gen:   Awake, no distress   Resp:  Normal effort  MSK:   Moves extremities without difficulty  Other:  Focal ttp of left lumbar paraspinous muscles, normal rom of LE throughout, can ambulate without difficulty  Medical Decision Making  Medically screening exam initiated at 1:34 PM.  Appropriate orders placed.  Erlin Gardella Mell was informed that the remainder of the evaluation will be completed by another provider, this initial triage assessment does not replace that evaluation, and the importance of remaining in the ED until their evaluation is complete.  Workup initiated in triage    Olene Floss, [place] 05/04/23 1336

## 2023-05-04 NOTE — Discharge Instructions (Signed)
 As discussed, you do have a 6 mm left-sided kidney stone as most likely cause of your symptoms.  You have an additional stone on the left side in the kidney not causing problems at this time.  Will send you home with medicine for pain as well as nausea and a medicine called Flomax to help aid in passage of the stone.  Recommend close follow-up with urology in the outpatient setting for reassessment.  Please do not hesitate to return if the worrisome signs and symptoms we discussed to become apparent.

## 2023-05-04 NOTE — ED Provider Notes (Signed)
  EMERGENCY DEPARTMENT AT [facility] Provider Note   CSN: [id number] Arrival date & time: 05/04/23  1312     History  Chief Complaint  Patient presents with   Back Pain    Paul [name] is a 43 y.o. male.   Back Pain   43 year old male presents emergency department with complaints of left low back pain.  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  States that the pain has been constant since onset.  Does report pain worsened with movement.  Took Advil prior to arrival without significant improvement.  Denies any fever, nausea, vomiting, urinary symptoms, change in bowel habits.  Denies any weakness/sensory deficits in lower extremities saddle anesthesia, bowel/bladder incontinence, history of IV drug use, no malignancy, prolonged corticosteroid use.  Denies history of kidney stones.  Past medical history significant for ADD, anxiety, depression, scoliosis  Home Medications Prior to Admission medications   Medication Sig Start Date End Date Taking? Authorizing Provider  diazepam (VALIUM) 5 MG tablet Take 5 mg by mouth in the morning. 12/01/22  Yes [provider]  ketorolac (TORADOL) 10 MG tablet Take 1 tablet (10 mg total) by mouth every 6 (six) hours as needed. 05/04/23  Yes Sherian Maroon A, PA  Omega-3 Fatty Acids (FISH OIL) 500 MG CAPS Take by mouth.   Yes [provider]  ondansetron (ZOFRAN-ODT) 4 MG disintegrating tablet Take 1 tablet (4 mg total) by mouth every 8 (eight) hours as needed for nausea or vomiting. 05/04/23  Yes Sherian Maroon A, PA  oxyCODONE (ROXICODONE) 5 MG immediate release tablet Take 1 tablet (5 mg total) by mouth every 6 (six) hours as needed for severe pain (pain score 7-10). 05/04/23  Yes Sherian Maroon A, PA  tamsulosin (FLOMAX) 0.4 MG CAPS capsule Take 1 capsule (0.4 mg total) by mouth daily. 05/04/23  Yes Sherian Maroon A, PA      Allergies    Sulfa antibiotics     Review of Systems   Review of Systems  Musculoskeletal:  Positive for back pain.  All other systems reviewed and are negative.   Physical Exam Updated Vital Signs BP (!) 126/91 (BP Location: Left Arm)   Pulse 71   Temp 98.8 F (37.1 C) (Oral)   Resp 18   Ht 5\' 8"  (1.727 m)   Wt 76.2 kg   SpO2 100%   BMI 25.54 kg/m  Physical Exam Vitals and nursing note reviewed.  Constitutional:      General: He is not in acute distress.    Appearance: He is well-developed.  HENT:     Head: Normocephalic and atraumatic.  Eyes:     Conjunctiva/sclera: Conjunctivae normal.  Cardiovascular:     Rate and Rhythm: Normal rate and regular rhythm.     Heart sounds: No murmur heard. Pulmonary:     Effort: Pulmonary effort is normal. No respiratory distress.     Breath sounds: Normal breath sounds.  Abdominal:     Palpations: Abdomen is soft.     Tenderness: There is no abdominal tenderness. There is left CVA tenderness.  Musculoskeletal:        General: No swelling.     Cervical back: Neck supple.  Skin:    General: Skin is warm and dry.     Capillary Refill: Capillary refill takes less than 2 seconds.  Neurological:     Mental Status: He is alert.  Psychiatric:  Mood and Affect: Mood normal.     ED Results / Procedures / Treatments   Labs (all labs ordered are listed, but only abnormal results are displayed) Labs Reviewed  URINALYSIS, ROUTINE W REFLEX MICROSCOPIC - Abnormal; Notable for the following components:      Result Value   Color, Urine AMBER (*)    APPearance HAZY (*)    Specific Gravity, Urine 1.038 (*)    Hgb urine dipstick LARGE (*)    Bilirubin Urine MODERATE (*)    Protein, ur 100 (*)    Bacteria, UA RARE (*)    All other components within normal limits  COMPREHENSIVE METABOLIC PANEL - Abnormal; Notable for the following components:   Sodium 132 (*)    Chloride 95 (*)    Glucose, Bld 135 (*)    Total Protein 6.4 (*)    Total Bilirubin 2.0 (*)     All other components within normal limits  CBC    EKG None  Radiology CT Renal Stone Study Result Date: 05/04/2023 CLINICAL DATA:  Abdominal pain, flank pain EXAM: CT ABDOMEN AND PELVIS WITHOUT CONTRAST TECHNIQUE: Multidetector CT imaging of the abdomen and pelvis was performed following the standard protocol without IV contrast. RADIATION DOSE REDUCTION: This exam was performed according to the departmental dose-optimization program which includes automated exposure control, adjustment of the mA and/or kV according to patient size and/or use of iterative reconstruction technique. COMPARISON:  None Available. FINDINGS: Lower chest: Dependent atelectasis.  No effusions. Hepatobiliary: Small layering gallstone within the gallbladder. No CT evidence of cholecystitis or biliary ductal dilatation. No focal hepatic abnormality. Pancreas: No focal abnormality or ductal dilatation. Spleen: No focal abnormality.  Normal size. Adrenals/Urinary Tract: Mild left hydronephrosis due to 6 mm mid left ureteral stone. Mild left hydronephrosis and perinephric stranding. 2 mm nonobstructing stone in the lower pole of the left kidney. No stones or hydronephrosis on the right. Adrenal glands and urinary bladder unremarkable. Stomach/Bowel: Normal appendix. Stomach, large and small bowel grossly unremarkable. Vascular/Lymphatic: No evidence of aneurysm or adenopathy. Reproductive: No visible focal abnormality. Other: No free fluid or free air. Musculoskeletal: No acute bony abnormality. IMPRESSION: 6 mm mid left ureteral stone with mild left hydronephrosis and perinephric stranding. Left lower pole nephrolithiasis. Cholelithiasis.  No CT evidence of acute cholecystitis. Electronically Signed   By: Charlett Nose M.D.   On: 05/04/2023 18:24   DG Lumbar Spine Complete Result Date: 05/04/2023 CLINICAL DATA:  Lumbar back pain. EXAM: LUMBAR SPINE - COMPLETE 4+ VIEW COMPARISON:  None Available. FINDINGS: Five non-rib-bearing lumbar  vertebra. Mild levo scoliotic curvature at the thoracolumbar junction. Normal lordosis. Normal vertebral body heights. Minor anterior spurring at multiple levels with preservation of disc spaces. No evidence of fracture, pars defect, or focal bone abnormality. The sacroiliac joints are congruent. IMPRESSION: 1. Mild levo scoliotic curvature at the thoracolumbar junction. 2. Minor spondylosis with anterior spurring at multiple levels, preservation of disc spaces. Electronically Signed   By: Narda [name] M.D.   On: 05/04/2023 15:10    Procedures Procedures    Medications Ordered in ED Medications  oxyCODONE-acetaminophen (PERCOCET/ROXICET) 5-325 MG per tablet 1 tablet (1 tablet Oral Given 05/04/23 1341)  fentaNYL (SUBLIMAZE) injection 25 mcg (25 mcg Intravenous Given 05/04/23 1659)  ondansetron (ZOFRAN) injection 4 mg (4 mg Intravenous Given 05/04/23 1740)  ketorolac (TORADOL) 15 MG/ML injection 15 mg (15 mg Intravenous Given 05/04/23 1801)    ED Course/ Medical Decision Making/ A&P  Medical Decision Making Amount and/or Complexity of Data Reviewed Labs: ordered. Radiology: ordered.  Risk Prescription drug management.   This patient presents to the ED for concern of flank pain, this involves an extensive number of treatment options, and is a complaint that carries with it a high risk of complications and morbidity.  The differential diagnosis includes nephritis, nephrolithiasis, strain/sprain, fracture, dislocation, cauda equina, spinal epidural abscess, other   Co morbidities that complicate the patient evaluation  See HPI   Additional history obtained:  Additional history obtained from EMR External records from outside source obtained and reviewed including hospital records   Lab Tests:  I Ordered, and personally interpreted labs.  The pertinent results include: UA with rare bacteria, 21-50 RBCs, no proteins, moderate bilirubin, large  hemoglobin.  No leukocytosis.  No evidence of anemia.  Platelets within range.  Hyponatremia, hypochloremia 1 30-95 respectively.  No transaminitis.  No renal dysfunction.   Imaging Studies ordered:  I ordered imaging studies including CT renal stone study, lumbar x-ray I independently visualized and interpreted imaging which showed  CT renal stone study: 6 mm mid left ureteral stone.  Left lower pole nephrolithiasis.  Cholelithiasis. Lumbar x-ray: Mild levoscoliotic curvature of thoracolumbar junction.  Mild spondylosis with anterior spurring at multiple levels. I agree with the radiologist interpretation  Cardiac Monitoring: / EKG:  The patient was maintained on a cardiac monitor.  I personally viewed and interpreted the cardiac monitored which showed an underlying rhythm of: Sinus rhythm   Consultations Obtained:  N/a   Problem List / ED Course / Critical interventions / Medication management  Flank pain, ureterolithiasis I ordered medication including Percocet, fentanyl, Zofran, Toradol Reevaluation of the patient after these medicines showed that the patient improved I have reviewed the patients home medicines and have made adjustments as needed   Social Determinants of Health:  Denies tobacco, licit drug use.   Test / Admission - Considered:  Flank pain, ureterolithiasis Vitals signs within normal range and stable throughout visit. Laboratory/imaging studies significant for: See above 43 year old male presents emergency department with complaints of left low back pain.  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  States that the pain has been constant since onset.  Does report pain worsened with movement.  Took Advil prior to arrival without significant improvement.  Denies any fever, nausea, vomiting, urinary symptoms, change in bowel habits.  Denies any weakness/sensory deficits in lower extremities saddle anesthesia,  bowel/bladder incontinence, history of IV drug use, no malignancy, prolonged corticosteroid use.  Denies history of kidney stones. On exam, reproducible tenderness left-sided CVA.  No red flag signs for back pain on HPI/PE; low suspicion for cauda equina, spinal epidural abscess, other spinal cord compression/impingement.  Patient without abdominal pain rating to back, pulse deficits, neurodeficits, hypertension; low suspicion for aortic dissection.  Patient is UA concerning for RBCs present.  CT renal stone study evidence of 6 mm mid left ureter stone as likely cause of symptoms.  Patient's pain significantly improved with medications administered on the emergency department.  No evidence of AKI/infectious complications.  Will treat patient symptoms and have him follow-up with urology in the outpatient setting.  Treatment plan discussed at length with patient and he acknowledged understanding was agreeable to said plan.  Patient overall well-appearing, afebrile in no acute distress. Worrisome signs and symptoms were discussed with the patient, and the patient acknowledged understanding to return to the ED if noticed. Patient was stable upon discharge.  Final Clinical Impression(s) / ED Diagnoses Final diagnoses:  Ureterolithiasis    Rx / DC Orders ED Discharge Orders          Ordered    oxyCODONE (ROXICODONE) 5 MG immediate release tablet  Every 6 hours PRN        05/04/23 1847    ondansetron (ZOFRAN-ODT) 4 MG disintegrating tablet  Every 8 hours PRN        05/04/23 1847    ketorolac (TORADOL) 10 MG tablet  Every 6 hours PRN        05/04/23 1847    tamsulosin (FLOMAX) 0.4 MG CAPS capsule  Daily        05/04/23 1847              Peter Garter, PA 05/04/23 Ferman Hamming, MD 05/05/23 1253

## 2023-05-04 NOTE — ED Triage Notes (Addendum)
 Pt presents with a 4 hour hx of L lumbar pain that hurts worse with walking. Denies any acute injury, urinary symptoms. Pt took ibuprofen when the pain first started.

## 2023-05-14 ENCOUNTER — Other Ambulatory Visit: Payer: Self-pay | Admitting: Urology

## 2023-05-14 DIAGNOSIS — R1084 Generalized abdominal pain: Secondary | ICD-10-CM | POA: Diagnosis not present

## 2023-05-14 DIAGNOSIS — N132 Hydronephrosis with renal and ureteral calculous obstruction: Secondary | ICD-10-CM | POA: Diagnosis not present

## 2023-05-18 NOTE — Progress Notes (Signed)
 Attempted preop phone call. Left instructions for arrival time to [facility] at 0600. Nothing to eat or drink after midnight. Stop Toradol and fish oil morning for 05/19/2023. May take oxycodone, valium, zofran, flomax by 4 AM 05/22/2023. Will need responsible driver to stay 24 hours after the procedure. Will make future attempts to contact.

## 2023-05-20 NOTE — Progress Notes (Signed)
 Attempted to call for preop instructions. Left message for arrival time to [facility] at 0600. To stop Toradol and fish oil as of 05/20/2023. Nothing to eat or drink after midnight. My take pain meds of needed by 4 AM.

## 2023-05-21 ENCOUNTER — Encounter (HOSPITAL_COMMUNITY): Payer: Self-pay | Admitting: Urology

## 2023-05-21 DIAGNOSIS — E559 Vitamin D deficiency, unspecified: Secondary | ICD-10-CM | POA: Diagnosis not present

## 2023-05-21 DIAGNOSIS — F411 Generalized anxiety disorder: Secondary | ICD-10-CM | POA: Diagnosis not present

## 2023-05-21 DIAGNOSIS — Z113 Encounter for screening for infections with a predominantly sexual mode of transmission: Secondary | ICD-10-CM | POA: Diagnosis not present

## 2023-05-21 DIAGNOSIS — E782 Mixed hyperlipidemia: Secondary | ICD-10-CM | POA: Diagnosis not present

## 2023-05-21 NOTE — Progress Notes (Signed)
 Spoke w/ via phone for pre-op interview--- Lab needs dos----KUB         Lab results------ COVID test  patient states asymptomatic no test needed Arrive at ---0600 NPO after MN  Pre-Surgery Ensure or G2:  Med rec completed Medications to take morning of surgery --If needed, zofran, valium, flomax, oxy Diabetic medication -----  GLP1 agonist last dose: GLP1 instructions:  Patient instructed no nail polish to be worn day of surgery Patient instructed to bring photo id and insurance card day of surgery Patient aware to have Driver (ride ) / caregiver    for 24 hours after surgery - spouse- Charlotted [phone] Patient Special Instructions ----- Pre-Op special Instructions --per Hayden Rasmussen--  Patient verbalized understanding of instructions that were given at this phone interview. Patient denies chest pain, sob, fever, cough at the interview.

## 2023-05-22 ENCOUNTER — Ambulatory Visit (HOSPITAL_COMMUNITY)
Admission: RE | Admit: 2023-05-22 | Discharge: 2023-05-22 | Disposition: A | Discharge status: Home / Self Care | Attending: Urology | Admitting: Urology

## 2023-05-22 ENCOUNTER — Encounter (HOSPITAL_COMMUNITY): Payer: Self-pay | Admitting: Urology

## 2023-05-22 ENCOUNTER — Ambulatory Visit (HOSPITAL_COMMUNITY)

## 2023-05-22 ENCOUNTER — Other Ambulatory Visit: Payer: Self-pay

## 2023-05-22 ENCOUNTER — Encounter (HOSPITAL_COMMUNITY)
Admission: RE | Disposition: A | Discharge status: Home / Self Care | Payer: Self-pay | Source: Home / Self Care | Attending: Urology

## 2023-05-22 DIAGNOSIS — I209 Angina pectoris, unspecified: Secondary | ICD-10-CM | POA: Insufficient documentation

## 2023-05-22 DIAGNOSIS — N201 Calculus of ureter: Secondary | ICD-10-CM | POA: Diagnosis not present

## 2023-05-22 DIAGNOSIS — I252 Old myocardial infarction: Secondary | ICD-10-CM | POA: Insufficient documentation

## 2023-05-22 DIAGNOSIS — Z955 Presence of coronary angioplasty implant and graft: Secondary | ICD-10-CM | POA: Diagnosis not present

## 2023-05-22 DIAGNOSIS — I878 Other specified disorders of veins: Secondary | ICD-10-CM | POA: Diagnosis not present

## 2023-05-22 HISTORY — PX: EXTRACORPOREAL SHOCK WAVE LITHOTRIPSY: SHX1557

## 2023-05-22 HISTORY — DX: Tremor, unspecified: R25.1

## 2023-05-22 SURGERY — LITHOTRIPSY, ESWL
Anesthesia: LOCAL | Laterality: Left

## 2023-05-22 MED ORDER — CEFAZOLIN SODIUM-DEXTROSE 2-4 GM/100ML-% IV SOLN: 2.0000 g | INTRAVENOUS | Status: DC

## 2023-05-22 MED ORDER — OXYCODONE HCL 5 MG PO TABS: 5.0000 mg | ORAL_TABLET | Freq: Four times a day (QID) | ORAL | 0 refills | Status: AC | PRN

## 2023-05-22 MED ORDER — SODIUM CHLORIDE 0.9 % IV SOLN
INTRAVENOUS | Status: DC
Start: 2023-05-22 — End: 2023-05-22

## 2023-05-22 MED ORDER — CIPROFLOXACIN HCL 500 MG PO TABS
500.0000 mg | ORAL_TABLET | ORAL | Status: AC
Start: 2023-05-22 — End: 2023-05-22
  Administered 2023-05-22: 500 mg via ORAL
  Filled 2023-05-22: qty 1

## 2023-05-22 MED ORDER — DIPHENHYDRAMINE HCL 25 MG PO CAPS
25.0000 mg | ORAL_CAPSULE | ORAL | Status: AC
  Administered 2023-05-22: 25 mg via ORAL
  Filled 2023-05-22: qty 1

## 2023-05-22 MED ORDER — DIAZEPAM 5 MG PO TABS
10.0000 mg | ORAL_TABLET | ORAL | Status: AC
  Administered 2023-05-22: 10 mg via ORAL
  Filled 2023-05-22: qty 2

## 2023-05-22 NOTE — Progress Notes (Signed)
 Left flank with red blotchy area and small blister in center from ESWL treatment.

## 2023-05-22 NOTE — Op Note (Signed)
 See [employer] OP note scanned into chart. Also because of the size, density, location and other factors that cannot be anticipated I feel this will likely be a staged procedure. This fact supersedes any indication in the scanned [place] stone operative note to the contrary.

## 2023-05-22 NOTE — H&P (Signed)
 CC/HPI: 1. left ureteral stone - presented to ED 3/17 with L flank pain. Found to have ureteral stone.   CT reviewed - 6 mm stone at L2-L3 on left with some associated mild hydro. small lower pole stone on same side, no stones on the right. stone visible on scout. mean HU 842   Patient endorses pain today although mostly on the right side. Has not passed stone to his knowledge   KUB repeated today. I believe I can see the stone in essentially the same spot from the CT scan.     ALLERGIES: sulfate    MEDICATIONS: Biotin  Desvenlafaxine Succinate ER 25 MG Tablet Extended Release 24 Hour 1 tablet PO Daily  Ketorolac  Ondansetron 4 MG Tablet Disintegrating  oxyCODONE HCl 5 MG Tablet 1 tablet PO Daily  Tamsulosin HCl 0.4 MG Capsule 1 capsule PO Daily  Vitamin B 12     GU PSH: Vasectomy - 2022     NON-GU PSH: No Non-GU PSH    GU PMH: Gross hematuria - 09/03/2022      PMH Notes:  1898-02-17 00:00:00 - Note: Normal Routine History And Physical Adult   NON-GU PMH: Encounter for vasectomy consultation - 2022, - 2022 Anxiety    FAMILY HISTORY: 1 Daughter - Daughter   SOCIAL HISTORY: Marital Status: Single Ethnicity: Not Hispanic Or Latino; Race: White Current Smoking Status: Patient has never smoked.   Tobacco Use Assessment Completed: Used Tobacco in last 30 days? Does drink.  Drinks 1 caffeinated drink per day. Patient's occupation [employer].    REVIEW OF SYSTEMS:    GU Review Male:   Patient denies frequent urination, hard to postpone urination, burning/ pain with urination, get up at night to urinate, leakage of urine, stream starts and stops, trouble starting your stream, have to strain to urinate , erection problems, and penile pain.  Gastrointestinal (Upper):   Patient denies nausea, vomiting, and indigestion/ heartburn.  Gastrointestinal (Lower):   Patient denies diarrhea and constipation.  Constitutional:   Patient denies fever, night sweats, weight loss, and  fatigue.  Skin:   Patient denies skin rash/ lesion and itching.  Eyes:   Patient denies blurred vision and double vision.  Ears/ Nose/ Throat:   Patient denies sore throat and sinus problems.  Hematologic/Lymphatic:   Patient denies swollen glands and easy bruising.  Cardiovascular:   Patient denies leg swelling and chest pains.  Respiratory:   Patient denies cough and shortness of breath.  Endocrine:   Patient denies excessive thirst.  Musculoskeletal:   Patient denies back pain and joint pain.  Neurological:   Patient denies headaches and dizziness.  Psychologic:   Patient denies depression and anxiety.   Notes: Pt went to ER last monday for 2 KS     VITAL SIGNS: None   MULTI-SYSTEM PHYSICAL EXAMINATION:    Constitutional: Well-nourished. No physical deformities. Normally developed. Good grooming.  Respiratory: No labored breathing, no use of accessory muscles.   Cardiovascular: Normal temperature, normal extremity pulses, no swelling, no varicosities.  Neurologic / Psychiatric: Oriented to time, oriented to place, oriented to person. No depression, no anxiety, no agitation.  Eyes: Normal conjunctivae. Normal eyelids.  Musculoskeletal: Normal gait and station of head and neck.     Complexity of Data:  Source Of History:  Patient, Medical Record Summary  Records Review:   Previous Doctor Records, Previous Patient Records  Urine Test Review:   Urinalysis  X-Ray Review: Outside CT: Reviewed Films. Reviewed Report. Discussed With Patient.  KUB: Reviewed Films. Discussed With Patient.     PROCEDURES:         KUB - [id number]  A single view of the abdomen is obtained. Calcification visible around L2/L3 on the left similar to stand position on CT scan.      Patient confirmed No Neulasta OnPro Device.           Urinalysis - 81003 Dipstick Dipstick Cont'd  Color: Straw Bilirubin: Neg  Appearance: Clear Ketones: Neg  Specific Gravity: <=1.005 Blood: Neg  pH: 5.5 Protein: Neg   Glucose: Neg Urobilinogen: 0.2    Nitrites: Neg    Leukocyte Esterase: Neg    Notes:      ASSESSMENT:      ICD-10 Details  1 GU:   Ureteral calculus - N20.1 Undiagnosed New Problem  2   Ureteral obstruction secondary to calculous - N13.2 Undiagnosed New Problem  3   Flank Pain - R10.84 Undiagnosed New Problem   PLAN:           Orders         Schedule X-Rays: Today 05/12/2023 - KUB          Document Letter(s):  Created for Patient: Clinical Summary         Notes:   We discussed the management of urinary stones. These options include observation, ureteroscopy, and shockwave lithotripsy. We discussed which options are relevant to these particular stones. We discussed the natural history of stones as well as the complications of untreated stones and the impact on quality of life without treatment as well as with each of the above listed treatments. We also discussed the efficacy of each treatment in its ability to clear the stone burden. With any of these management options I discussed the signs and symptoms of infection and the need for emergent treatment should these be experienced. For each option we discussed the ability of each procedure to clear the patient of their stone burden.   For observation I described the risks which include but are not limited to silent renal damage, life-threatening infection, need for emergent surgery, failure to pass stone, and pain.   For ureteroscopy I described the risks which include heart attack, stroke, pulmonary embolus, death, bleeding, infection, damage to contiguous structures, positioning injury, ureteral stricture, ureteral avulsion, ureteral injury, need for ureteral stent, inability to perform ureteroscopy, need for an interval procedure, inability to clear stone burden, stent discomfort and pain.   For shockwave lithotripsy I described the risks which include arrhythmia, kidney contusion, kidney hemorrhage, need for transfusion, pain,  inability to break up stone, inability to pass stone fragments, Steinstrasse, infection associated with obstructing stones, need for different surgical procedure, need for repeat shockwave lithotripsy.   Ultimately patient elected to move forward with ESWL. Warning signs and symptoms reviewed

## 2023-05-22 NOTE — Discharge Instructions (Addendum)
 See [facility] discharge instructions in chart.

## 2023-05-25 ENCOUNTER — Encounter (HOSPITAL_COMMUNITY): Payer: Self-pay | Admitting: Urology

## 2024-01-13 DIAGNOSIS — Z87442 Personal history of urinary calculi: Secondary | ICD-10-CM | POA: Diagnosis not present

## 2024-01-13 DIAGNOSIS — M791 Myalgia, unspecified site: Secondary | ICD-10-CM | POA: Diagnosis not present

## 2024-01-13 DIAGNOSIS — R10A0 Flank pain, unspecified side: Secondary | ICD-10-CM | POA: Diagnosis not present

## 2024-01-13 DIAGNOSIS — K8020 Calculus of gallbladder without cholecystitis without obstruction: Secondary | ICD-10-CM | POA: Diagnosis not present

## 2024-01-13 DIAGNOSIS — R10A2 Flank pain, left side: Secondary | ICD-10-CM | POA: Diagnosis not present

## 2024-01-13 DIAGNOSIS — M549 Dorsalgia, unspecified: Secondary | ICD-10-CM | POA: Diagnosis not present
